# Patient Record
Sex: Female | Born: 1990 | Race: Black or African American | Hispanic: No | Marital: Single | Smoking: Current every day smoker
Health system: Southern US, Community
[De-identification: ages and names within clinical notes are randomized; demographics above are authoritative.]

## PROBLEM LIST (undated history)

## (undated) DIAGNOSIS — I2699 Other pulmonary embolism without acute cor pulmonale: Secondary | ICD-10-CM

## (undated) DIAGNOSIS — B977 Papillomavirus as the cause of diseases classified elsewhere: Secondary | ICD-10-CM

## (undated) DIAGNOSIS — I10 Essential (primary) hypertension: Secondary | ICD-10-CM

## (undated) HISTORY — DX: Papillomavirus as the cause of diseases classified elsewhere: B97.7

---

## 2018-02-09 ENCOUNTER — Other Ambulatory Visit: Payer: Self-pay

## 2018-02-09 ENCOUNTER — Encounter (HOSPITAL_COMMUNITY): Payer: Self-pay | Admitting: Emergency Medicine

## 2018-02-09 ENCOUNTER — Ambulatory Visit (HOSPITAL_COMMUNITY)
Admission: EM | Admit: 2018-02-09 | Discharge: 2018-02-09 | Disposition: A | Discharge status: Home / Self Care | Payer: Medicaid Other | Attending: Family Medicine | Admitting: Family Medicine

## 2018-02-09 DIAGNOSIS — R110 Nausea: Secondary | ICD-10-CM | POA: Diagnosis present

## 2018-02-09 DIAGNOSIS — R109 Unspecified abdominal pain: Secondary | ICD-10-CM | POA: Diagnosis not present

## 2018-02-09 DIAGNOSIS — F17210 Nicotine dependence, cigarettes, uncomplicated: Secondary | ICD-10-CM | POA: Insufficient documentation

## 2018-02-09 LAB — POCT URINALYSIS DIP (DEVICE)
BILIRUBIN URINE: NEGATIVE
Glucose, UA: NEGATIVE mg/dL
HGB URINE DIPSTICK: NEGATIVE
Ketones, ur: NEGATIVE mg/dL
LEUKOCYTES UA: NEGATIVE
Nitrite: NEGATIVE
PH: 7 (ref 5.0–8.0)
Protein, ur: NEGATIVE mg/dL
SPECIFIC GRAVITY, URINE: 1.02 (ref 1.005–1.030)
UROBILINOGEN UA: 0.2 mg/dL (ref 0.0–1.0)

## 2018-02-09 LAB — POCT PREGNANCY, URINE: Preg Test, Ur: NEGATIVE

## 2018-02-09 NOTE — ED Provider Notes (Signed)
[facility] CARE CENTER   [id number] 02/09/18 Arrival Time: 1700  SUBJECTIVE:  Angela Cantrell is a 27 y.o. female who presents with complaint of abdominal discomfort that began gradually today.  Denies a precipitating event, or trauma.  Localizes pain to right flank.  Describes as constant and sharp in character.  Has tried ibuprofen without relief.  Worse with sitting down. Denies similar symptoms in the past.  Last BM today normal for patient.  Complains of mild nausea.   Denies fever, chills, appetite changes, weight changes, vomiting, diarrhea, constipation, hematochezia, melena, dysuria, difficulty urinating, increased frequency or urgency, flank pain, loss of bowel or bladder function, vaginal or pelvic pain, vaginal bleeding.    Patient concerned about IUD placement.  Recently had a friend whose IUD had moved and requests pelvic exam.    Patient's last menstrual period was 01/14/2018.  ROS: As per HPI.  History reviewed. No pertinent past medical history. Past Surgical History:  Procedure Laterality Date  . CESAREAN SECTION     No Known Allergies No current facility-administered medications on file prior to encounter.    No current outpatient medications on file prior to encounter.   Social History   Socioeconomic History  . Marital status: Single    Spouse name: Not on file  . Number of children: Not on file  . Years of education: Not on file  . Highest education level: Not on file  Occupational History  . Not on file  Social Needs  . Financial resource strain: Not on file  . Food insecurity:    Worry: Not on file    Inability: Not on file  . Transportation needs:    Medical: Not on file    Non-medical: Not on file  Tobacco Use  . Smoking status: Current Every Day Smoker    Packs/day: 1.00  Substance and Sexual Activity  . Alcohol use: Yes  . Drug use: Yes    Types: Marijuana  . Sexual activity: Not on file  Lifestyle  . Physical activity:    Days  per week: Not on file    Minutes per session: Not on file  . Stress: Not on file  Relationships  . Social connections:    Talks on phone: Not on file    Gets together: Not on file    Attends religious service: Not on file    Active member of club or organization: Not on file    Attends meetings of clubs or organizations: Not on file    Relationship status: Not on file  . Intimate partner violence:    Fear of current or ex partner: Not on file    Emotionally abused: Not on file    Physically abused: Not on file    Forced sexual activity: Not on file  Other Topics Concern  . Not on file  Social History Narrative  . Not on file   Family History  Problem Relation Age of Onset  . Hypertension Mother   . Hypertension Father      OBJECTIVE:  Vitals:   02/09/18 1803  BP: (!) 131/93  Pulse: 62  Resp: 20  Temp: 98.3 F (36.8 C)  TempSrc: Oral  SpO2: 98%    General appearance: AOx3 in no acute distress; talking on phone HEENT: NCAT.  Oropharynx clear.  Lungs: clear to auscultation bilaterally without adventitious breath sounds Heart: regular rate and rhythm.  Radial pulses 2+ symmetrical bilaterally Abdomen: soft, non-distended; normal active bowel sounds; non-tender to light and  deep palpation; nontender at McBurney's point; negative Murphy's sign; negative rebound; no guarding; symptoms not reproducible on exam GU: On external examination no obvious lesions, discharge, or masses Bimanual exam performed prior to speculum exam.  Negative cervical motion or adenexal tenderness Speculum exam: Cervix visualized without obvious discharge from the cervix; IUD strings in 6 o'clock position.   Back: no CVA tenderness Extremities: no edema; symmetrical with no gross deformities Skin: warm and dry Neurologic: normal gait Psychological: alert and cooperative; anxious  Labs: Results for orders placed or performed during the hospital encounter of 02/09/18  POCT urinalysis dip  (device)  Result Value Ref Range   Glucose, UA NEGATIVE NEGATIVE mg/dL   Bilirubin Urine NEGATIVE NEGATIVE   Ketones, ur NEGATIVE NEGATIVE mg/dL   Specific Gravity, Urine 1.020 1.005 - 1.030   Hgb urine dipstick NEGATIVE NEGATIVE   pH 7.0 5.0 - 8.0   Protein, ur NEGATIVE NEGATIVE mg/dL   Urobilinogen, UA 0.2 0.0 - 1.0 mg/dL   Nitrite NEGATIVE NEGATIVE   Leukocytes, UA NEGATIVE NEGATIVE  Pregnancy, urine POC  Result Value Ref Range   Preg Test, Ur NEGATIVE NEGATIVE   Labs Reviewed  POCT URINALYSIS DIP (DEVICE)  POCT PREGNANCY, URINE  CERVICOVAGINAL ANCILLARY ONLY    Imaging: No results found.   ASSESSMENT & PLAN:  1. Abdominal discomfort in right flank     No orders of the defined types were placed in this encounter.  Urine did not show sign of infection, we will send it out to culture and notify you of abnormal results Mirena strings in place.  Please follow up with your PCP for further evaluation and treatment of IUD Urine pregnancy test was negative Cervical cytology obtained.  We will notify you with positive results and send in appropriate medications.  If any results are positive please refrain from sexual intercourse for 7 days and notify partners.   Rest, ice, and heat as needed Continue to use OTC ibuprofen as needed for symptomatic relief Go to the ER if you have any new or worsening symptoms  Reviewed expectations re: course of current medical issues. Questions answered. Outlined signs and symptoms indicating need for more acute intervention. Patient verbalized understanding. After Visit Summary given.   Rennis Harding, PA-C 02/09/18 1903

## 2018-02-09 NOTE — Discharge Instructions (Addendum)
Urine did not show sign of infection, we will send it out to culture and notify you of abnormal results Mirena strings in place.  Please follow up with your PCP for further evaluation and treatment of IUD Urine pregnancy test was negative Cervical cytology obtained.  We will notify you with positive results and send in appropriate medications.  If any results are positive please refrain from sexual intercourse for 7 days and notify partners.   Rest, ice, and heat as needed Continue to use OTC ibuprofen as needed for symptomatic relief Go to the ER if you have any new or worsening symptoms

## 2018-02-09 NOTE — ED Triage Notes (Signed)
Woke from a nap with abdominal pain.  Denies pain with urination.  Pain in right abdomen, right flank area.  Last bm was today, normal.

## 2018-02-10 LAB — CERVICOVAGINAL ANCILLARY ONLY
BACTERIAL VAGINITIS: NEGATIVE
Candida vaginitis: NEGATIVE
Chlamydia: NEGATIVE
Neisseria Gonorrhea: NEGATIVE
TRICH (WINDOWPATH): NEGATIVE

## 2018-02-10 LAB — URINE CULTURE: CULTURE: NO GROWTH

## 2018-08-07 ENCOUNTER — Ambulatory Visit (HOSPITAL_COMMUNITY)
Admission: EM | Admit: 2018-08-07 | Discharge: 2018-08-07 | Discharge status: Against Medical Advice | Payer: Medicaid Other

## 2019-03-09 ENCOUNTER — Telehealth: Payer: Self-pay

## 2019-03-09 NOTE — Telephone Encounter (Signed)
Received a referral from [facility] regarding birth control counseling. Scheduled the patient with a virtual visit to review options. Called the patient and left a detailed voicemail of the mychart visit and referral. Also sending a patient reminder letter with instructions.

## 2019-03-10 ENCOUNTER — Encounter: Payer: Self-pay | Admitting: *Deleted

## 2019-03-15 ENCOUNTER — Telehealth: Payer: Self-pay | Admitting: Family Medicine

## 2019-03-15 NOTE — Telephone Encounter (Signed)
Called patient was unable to get through, I was calling to remind her of her appt on 6-30, also calling to see if she downloaded the Mychart app.

## 2019-03-16 ENCOUNTER — Encounter: Payer: Self-pay | Admitting: Medical

## 2019-03-16 ENCOUNTER — Telehealth: Payer: Medicaid Other | Admitting: Medical

## 2019-03-16 ENCOUNTER — Other Ambulatory Visit: Payer: Self-pay

## 2019-03-16 NOTE — Progress Notes (Signed)
11:02a- Called pt for My Chart visit, immediately went to VM, Left message statinmg will retry in 10 to 15 minutes.  11:22a- 2nd attempt, call again went straight to VM. Needs t be rescheduled.

## 2019-03-16 NOTE — Progress Notes (Signed)
CMA unable to reach patient after multiple attempts.   Luvenia Redden, PA-C 03/16/2019 11:45 AM

## 2019-08-24 DIAGNOSIS — K219 Gastro-esophageal reflux disease without esophagitis: Secondary | ICD-10-CM | POA: Insufficient documentation

## 2019-12-01 ENCOUNTER — Other Ambulatory Visit: Payer: Self-pay | Admitting: Otolaryngology

## 2019-12-01 DIAGNOSIS — R591 Generalized enlarged lymph nodes: Secondary | ICD-10-CM

## 2019-12-15 ENCOUNTER — Other Ambulatory Visit (HOSPITAL_COMMUNITY): Payer: Self-pay | Admitting: Otolaryngology

## 2019-12-15 DIAGNOSIS — R591 Generalized enlarged lymph nodes: Secondary | ICD-10-CM

## 2019-12-20 ENCOUNTER — Ambulatory Visit (HOSPITAL_COMMUNITY): Payer: Medicaid Other

## 2020-01-06 ENCOUNTER — Other Ambulatory Visit (HOSPITAL_COMMUNITY): Payer: Self-pay | Admitting: Otolaryngology

## 2020-01-06 DIAGNOSIS — R591 Generalized enlarged lymph nodes: Secondary | ICD-10-CM

## 2020-01-11 ENCOUNTER — Ambulatory Visit (HOSPITAL_COMMUNITY): Payer: Medicaid Other

## 2020-01-14 ENCOUNTER — Ambulatory Visit (HOSPITAL_COMMUNITY)
Admission: RE | Admit: 2020-01-14 | Discharge: 2020-01-14 | Disposition: A | Discharge status: Home / Self Care | Payer: Medicaid Other | Source: Ambulatory Visit | Attending: Otolaryngology | Admitting: Otolaryngology

## 2020-01-14 ENCOUNTER — Other Ambulatory Visit: Payer: Self-pay

## 2020-01-14 DIAGNOSIS — R591 Generalized enlarged lymph nodes: Secondary | ICD-10-CM | POA: Diagnosis present

## 2020-01-17 ENCOUNTER — Encounter (HOSPITAL_COMMUNITY): Payer: Self-pay | Admitting: Radiology

## 2020-01-17 NOTE — Progress Notes (Signed)
Angela Cantrell Female, 29 y.o., March 03, 1991 MRN:  [id number] Phone:  [phone] Judie Petit) PCP:  System, Pcp Not In Coverage:  Medicaid [employer]/Medicaid [place] Access Next Appt With Radiology ([facility]-US 2) 01/21/2020 at 1:00 PM  RE: US Biopsy Received: Today Message Contents  Oley Balm, MD  Sherry Ruffing   [place] core bx cervical LAN    [facility]       Previous Messages   ----- Message -----  From: Henry Russel D  Sent: 01/17/2020  1:00 PM EDT  To: Ir Procedure Requests  Subject: US Biopsy                     Procedure: US Biopsy (lymph Node)   Reason:  Lymphadenopathy   History:  [place] in computer   Provider: Suzanna Obey   Provider Contact: [phone]

## 2020-01-20 ENCOUNTER — Other Ambulatory Visit: Payer: Self-pay | Admitting: Radiology

## 2020-01-21 ENCOUNTER — Other Ambulatory Visit: Payer: Self-pay

## 2020-01-21 ENCOUNTER — Ambulatory Visit (HOSPITAL_COMMUNITY)
Admission: RE | Admit: 2020-01-21 | Discharge: 2020-01-21 | Disposition: A | Discharge status: Home / Self Care | Payer: Medicaid Other | Source: Ambulatory Visit | Attending: Otolaryngology | Admitting: Otolaryngology

## 2020-01-21 DIAGNOSIS — F17210 Nicotine dependence, cigarettes, uncomplicated: Secondary | ICD-10-CM | POA: Diagnosis not present

## 2020-01-21 DIAGNOSIS — R591 Generalized enlarged lymph nodes: Secondary | ICD-10-CM

## 2020-01-21 DIAGNOSIS — R590 Localized enlarged lymph nodes: Secondary | ICD-10-CM | POA: Insufficient documentation

## 2020-01-21 LAB — PREGNANCY, URINE: Preg Test, Ur: NEGATIVE

## 2020-01-21 MED ORDER — LIDOCAINE HCL (PF) 1 % IJ SOLN
INTRAMUSCULAR | Status: AC
  Filled 2020-01-21: qty 30

## 2020-01-21 MED ORDER — FENTANYL CITRATE (PF) 100 MCG/2ML IJ SOLN
INTRAMUSCULAR | Status: AC | PRN
  Administered 2020-01-21 (×2): 50 ug via INTRAVENOUS

## 2020-01-21 MED ORDER — MIDAZOLAM HCL 2 MG/2ML IJ SOLN
INTRAMUSCULAR | Status: AC | PRN
  Administered 2020-01-21: 2 mg via INTRAVENOUS
  Administered 2020-01-21 (×2): 1 mg via INTRAVENOUS

## 2020-01-21 MED ORDER — SODIUM CHLORIDE 0.9 % IV SOLN: INTRAVENOUS | Status: DC

## 2020-01-21 MED ORDER — FENTANYL CITRATE (PF) 100 MCG/2ML IJ SOLN
INTRAMUSCULAR | Status: AC
  Filled 2020-01-21: qty 2

## 2020-01-21 MED ORDER — MIDAZOLAM HCL 2 MG/2ML IJ SOLN
INTRAMUSCULAR | Status: AC
  Filled 2020-01-21: qty 2

## 2020-01-21 NOTE — H&P (Signed)
Chief Complaint: Patient was seen in consultation today for lymphadenopathy at the request of Byers,John  Referring Physician(s): Byers,John  Supervising Physician: Malachy Moan  Patient Status: [facility] - Out-pt  History of Present Illness: Angela Cantrell is a 29 y.o. female with a past medical history of a pulmonary embolism in 2017 which occurred shortly after a pregnancy that culminated with a placental abruption, cesarean section and fetal demise. She is not on any blood thinners.   She has had bilateral cervical lymphadenopathy for one year.   Ultrasound imaging 01/14/20: Lobular enlarged lymph node in the right upper neck measures 2.1 x 1.0 x 1.0 cm. In the upper left neck there is a rounded enlarged lymph node that measures 1.6 x 0.7 x 0.9 cm.  Interventional Radiology has been asked to evaluate this patient for a left cervical lymph node biopsy for further evaluation and work up.   No past medical history on file.  Past Surgical History:  Procedure Laterality Date  . CESAREAN SECTION      Allergies: Patient has no known allergies.  Medications: Prior to Admission medications   Not on File     Family History  Problem Relation Age of Onset  . Hypertension Mother   . Hypertension Father     Social History   Socioeconomic History  . Marital status: Single    Spouse name: Not on file  . Number of children: Not on file  . Years of education: Not on file  . Highest education level: Not on file  Occupational History  . Not on file  Tobacco Use  . Smoking status: Current Every Day Smoker    Packs/day: 1.00  Substance and Sexual Activity  . Alcohol use: Yes  . Drug use: Yes    Types: Marijuana  . Sexual activity: Not on file  Other Topics Concern  . Not on file  Social History Narrative  . Not on file   Social Determinants of Health   Financial Resource Strain:   . Difficulty of Paying Living Expenses:   Food Insecurity:   . Worried  About Programme researcher, broadcasting/film/video in the Last Year:   . Barista in the Last Year:   Transportation Needs:   . Freight forwarder (Medical):   Marland Kitchen Lack of Transportation (Non-Medical):   Physical Activity:   . Days of Exercise per Week:   . Minutes of Exercise per Session:   Stress:   . Feeling of Stress :   Social Connections:   . Frequency of Communication with Friends and Family:   . Frequency of Social Gatherings with Friends and Family:   . Attends Religious Services:   . Active Member of Clubs or Organizations:   . Attends Banker Meetings:   Marland Kitchen Marital Status:     Review of Systems: A 12 point ROS discussed and pertinent positives are indicated in the HPI above.  All other systems are negative.  Review of Systems  Constitutional: Negative for activity change, appetite change, fatigue and fever.  Respiratory: Negative for cough and shortness of breath.   Cardiovascular: Negative for chest pain and leg swelling.  Gastrointestinal: Negative for abdominal distention and abdominal pain.  Musculoskeletal: Positive for neck stiffness.       Waxing/waning neck stiffness  Neurological: Negative for syncope and headaches.    Vital Signs: BP (!) 141/103   Pulse (!) 58   Temp 97.6 F (36.4 C) (Skin)   Resp 16  Ht 5\' 2"  (1.575 m)   Wt 230 lb (104.3 kg)   SpO2 97%   BMI 42.07 kg/m   Physical Exam Constitutional:      General: She is not in acute distress.    Appearance: Normal appearance.  Cardiovascular:     Rate and Rhythm: Normal rate and regular rhythm.     Pulses: Normal pulses.     Heart sounds: Normal heart sounds.  Pulmonary:     Effort: Pulmonary effort is normal.     Breath sounds: Normal breath sounds.  Abdominal:     General: Bowel sounds are normal. There is no distension.     Palpations: Abdomen is soft.     Tenderness: There is no abdominal tenderness.  Musculoskeletal:     Comments: Intermittent bilateral neck tenderness.   Skin:     General: Skin is warm and dry.  Neurological:     Mental Status: She is alert and oriented to person, place, and time.  Psychiatric:        Mood and Affect: Mood normal.        Behavior: Behavior normal.        Thought Content: Thought content normal.        Judgment: Judgment normal.     Imaging: SOFT TISSUE HEAD & NECK (NON-THYROID)  Result Date: 01/15/2020 CLINICAL DATA:  Bilateral cervical lymphadenopathy for 1 year EXAM: ULTRASOUND OF HEAD/NECK SOFT TISSUES TECHNIQUE: Ultrasound examination of the head and neck soft tissues was performed in the area of clinical concern. COMPARISON:  None. FINDINGS: Lobular enlarged lymph node in the right upper neck measures 2.1 x 1.0 x 1.0 cm. In the upper left neck there is a rounded enlarged lymph node that measures 1.6 x 0.7 x 0.9 cm. IMPRESSION: Bilateral enlarged cervical lymph nodes with rounded morphology. CT of the neck with contrast is recommended for further characterization. Electronically Signed   By: 03/16/2020 M.D.   On: 01/15/2020 00:03    Labs:  CBC: No results for input(s): WBC, HGB, HCT, PLT in the last 8760 hours.  COAGS: No results for input(s): INR, APTT in the last 8760 hours.  BMP: No results for input(s): NA, K, CL, CO2, GLUCOSE, BUN, CALCIUM, CREATININE, GFRNONAA, GFRAA in the last 8760 hours.  Invalid input(s): CMP  LIVER FUNCTION TESTS: No results for input(s): BILITOT, AST, ALT, ALKPHOS, PROT, ALBUMIN in the last 8760 hours.  TUMOR MARKERS: No results for input(s): AFPTM, CEA, CA199, CHROMGRNA in the last 8760 hours.  Assessment and Plan: Angela Cantrell is a 29 year-old female with a history of lymphadenopathy for one year. Recent ultrasound demonstrates enlarged cervical lymph nodes.   The patient presents today to the [facility] Interventional Radiology department for an image-guided lymph node biopsy. The patient will not be sedated for this procedure.   Risks and benefits of an image-guided lymph node  was discussed with the patient and/or patient's family including, but not limited to bleeding, infection, damage to adjacent structures or low yield requiring additional tests.  All of the questions were answered and there is agreement to proceed.  Consent signed and in chart.   Thank you for this interesting consult.  I greatly enjoyed meeting [facility] and look forward to participating in their care.  A copy of this report was sent to the requesting provider on this date.  Electronically Signed: Jiles Prows, NP 01/21/2020, 11:27 AM   I spent a total of  30 Minutes  in  face to face in clinical consultation, greater than 50% of which was counseling/coordinating care for lymph node biopsy.

## 2020-01-21 NOTE — Discharge Instructions (Addendum)

## 2020-01-21 NOTE — Procedures (Signed)
Interventional Radiology Procedure Note  Procedure: [place] core biopsy of right high cervical/submandibular lymph node.   Complications: None  Estimated Blood Loss: None  Recommendations: - DC HOME  Signed,  Sterling Big, MD

## 2020-01-25 LAB — SURGICAL PATHOLOGY

## 2020-09-26 ENCOUNTER — Encounter (HOSPITAL_COMMUNITY): Payer: Self-pay

## 2020-09-26 ENCOUNTER — Other Ambulatory Visit: Payer: Self-pay

## 2020-09-26 ENCOUNTER — Ambulatory Visit (HOSPITAL_COMMUNITY)
Admission: EM | Admit: 2020-09-26 | Discharge: 2020-09-26 | Disposition: A | Discharge status: Home / Self Care | Payer: Medicaid Other | Attending: Family Medicine | Admitting: Family Medicine

## 2020-09-26 DIAGNOSIS — U071 COVID-19: Secondary | ICD-10-CM | POA: Insufficient documentation

## 2020-09-26 DIAGNOSIS — R52 Pain, unspecified: Secondary | ICD-10-CM

## 2020-09-26 DIAGNOSIS — R059 Cough, unspecified: Secondary | ICD-10-CM

## 2020-09-26 DIAGNOSIS — R6883 Chills (without fever): Secondary | ICD-10-CM | POA: Diagnosis present

## 2020-09-26 LAB — SARS CORONAVIRUS 2 (TAT 6-24 HRS): SARS Coronavirus 2: POSITIVE — AB

## 2020-09-26 NOTE — Discharge Instructions (Signed)
You have been tested for COVID-19 today. °If your test returns positive, you will receive a phone call from [facility] regarding your results. °Negative test results are not called. °Both positive and negative results area always visible on MyChart. °If you do not have a MyChart account, sign up instructions are provided in your discharge papers. °Please do not hesitate to contact us should you have questions or concerns. ° °

## 2020-09-26 NOTE — ED Triage Notes (Signed)
Pt presenst with cough x 2 days; chills, body aches and  headache x 1 day. Tylenol gives somewhat relief. Denies fever, sore throat, shortness of breath.

## 2020-09-26 NOTE — ED Provider Notes (Signed)
  [facility] CARE CENTER   [id number] 09/26/20 Arrival Time: 1228  ASSESSMENT & PLAN:  1. Chills   2. Cough   3. Body aches     COVID-19 testing sent. See letter/work note on file for self-isolation guidelines. OTC symptom care as needed.    Follow-up Information    [facility].   Specialty: Urgent Care Why: As needed. Contact information: [address] [place] [address] [phone]              Reviewed expectations re: course of current medical issues. Questions answered. Outlined signs and symptoms indicating need for more acute intervention. Understanding verbalized. After Visit Summary given.   SUBJECTIVE: History from: patient. Angela Cantrell is a 30 y.o. female who presents with worries regarding COVID-19. Known COVID-19 contact: none. Recent travel: none. Reports: cough, chills, body aches, headache x 1 day; son also sick. Denies: fever and difficulty breathing. Normal PO intake without n/v/d.    OBJECTIVE:  Vitals:   09/26/20 1351  BP: (!) 144/97  Pulse: 61  Resp: 18  Temp: 99.6 F (37.6 C)  TempSrc: Oral  SpO2: 100%    General appearance: alert; no distress Eyes: PERRLA; EOMI; conjunctiva normal HENT: [place]; AT; with nasal congestion Neck: supple  Lungs: speaks full sentences without difficulty; unlabored Extremities: no edema Skin: warm and dry Neurologic: normal gait Psychological: alert and cooperative; normal mood and affect  Labs:  Labs Reviewed  SARS CORONAVIRUS 2 (TAT 6-24 HRS)    No Known Allergies  No past medical history on file. Social History   Socioeconomic History  . Marital status: Single    Spouse name: Not on file  . Number of children: Not on file  . Years of education: Not on file  . Highest education level: Not on file  Occupational History  . Not on file  Tobacco Use  . Smoking status: Current Every Day Smoker    Packs/day: 1.00  . Smokeless tobacco:  Never Used  Substance and Sexual Activity  . Alcohol use: Yes  . Drug use: Yes    Types: Marijuana  . Sexual activity: Not on file  Other Topics Concern  . Not on file  Social History Narrative  . Not on file   Social Determinants of Health   Financial Resource Strain: Not on file  Food Insecurity: Not on file  Transportation Needs: Not on file  Physical Activity: Not on file  Stress: Not on file  Social Connections: Not on file  Intimate Partner Violence: Not on file   Family History  Problem Relation Age of Onset  . Hypertension Mother   . Hypertension Father    Past Surgical History:  Procedure Laterality Date  . CESAREAN SECTION       Mardella Layman, MD 09/26/20 1414

## 2020-12-20 ENCOUNTER — Other Ambulatory Visit: Payer: Self-pay

## 2020-12-20 ENCOUNTER — Ambulatory Visit (INDEPENDENT_AMBULATORY_CARE_PROVIDER_SITE_OTHER): Payer: Medicaid Other | Admitting: Certified Nurse Midwife

## 2020-12-20 ENCOUNTER — Encounter: Payer: Self-pay | Admitting: Certified Nurse Midwife

## 2020-12-20 ENCOUNTER — Other Ambulatory Visit (HOSPITAL_COMMUNITY)
Admission: RE | Admit: 2020-12-20 | Discharge: 2020-12-20 | Disposition: A | Discharge status: Home / Self Care | Payer: Medicaid Other | Source: Ambulatory Visit | Attending: Certified Nurse Midwife | Admitting: Certified Nurse Midwife

## 2020-12-20 VITALS — BP 118/86 | HR 65 | Wt 203.9 lb

## 2020-12-20 DIAGNOSIS — R87610 Atypical squamous cells of undetermined significance on cytologic smear of cervix (ASC-US): Secondary | ICD-10-CM | POA: Insufficient documentation

## 2020-12-20 DIAGNOSIS — Z113 Encounter for screening for infections with a predominantly sexual mode of transmission: Secondary | ICD-10-CM

## 2020-12-20 DIAGNOSIS — Z01419 Encounter for gynecological examination (general) (routine) without abnormal findings: Secondary | ICD-10-CM | POA: Insufficient documentation

## 2020-12-20 DIAGNOSIS — R4589 Other symptoms and signs involving emotional state: Secondary | ICD-10-CM

## 2020-12-20 DIAGNOSIS — L732 Hidradenitis suppurativa: Secondary | ICD-10-CM | POA: Diagnosis not present

## 2020-12-20 MED ORDER — CLINDAMYCIN PHOSPHATE 1 % EX GEL: Freq: Two times a day (BID) | CUTANEOUS | 11 refills | Status: DC

## 2020-12-20 MED ORDER — CLINDAMYCIN PHOSPHATE 1 % EX SOLN: Freq: Every day | CUTANEOUS | 4 refills | Status: DC

## 2020-12-20 NOTE — Progress Notes (Signed)
GYNECOLOGY CLINIC ANNUAL PREVENTATIVE CARE ENCOUNTER NOTE  Subjective:   Angela Cantrell is a 30 y.o. [phone] female here for a routine annual gynecologic exam.  Current complaints: ongoing struggle with "painful bumps that come in go in areas that sweat and have hair follicles." Has never been diagnosed with hidradenitis supperativa but has been on antibiotics (doxycycline) off and on for the bumps. Has been trying to get pregnant (unsuccessfully) but has regular cycles and no problems getting pregnant in the past. Husband is a newly diagnosed T2DM, just able to bring A1C to normal range.  Denies abnormal vaginal bleeding, discharge, pelvic pain, problems with intercourse or other gynecologic concerns.    Gynecologic History Patient's last menstrual period was 11/29/2020 (approximate). Contraception: none, desires pregnancy Last Pap: Unknown, last in system was 2014. Results were: abnormal Last mammogram: N/A.   Obstetric History OB History  Gravida Para Term Preterm AB Living  $Remov'4 4 2 2 'qFpYKc$ 0 2  SAB IAB Ectopic Multiple Live Births  0 0 0 0 3    # Outcome Date GA Lbr Len/2nd Weight Sex Delivery Anes PTL Lv  4 Preterm 2017    M CS-LVertical   ND  3 Preterm 2016    M Vag-Spont   FD  2 Term 2010    M CS-LVertical   LIV  1 Term 2007    M CS-LVertical   LIV   No past medical history on file.  Past Surgical History:  Procedure Laterality Date  . CESAREAN SECTION     Current Outpatient Medications on File Prior to Visit  Medication Sig Dispense Refill  . VITAMIN D PO Take by mouth once a week.    Marland Kitchen acetaminophen (TYLENOL) 325 MG tablet Take 650 mg by mouth every 6 (six) hours as needed. (Patient not taking: Reported on 12/20/2020)    . doxycycline (VIBRAMYCIN) 100 MG capsule Take 100 mg by mouth 2 (two) times daily. (Patient not taking: Reported on 12/20/2020)    . meloxicam (MOBIC) 15 MG tablet Take 1 tablet by mouth daily. (Patient not taking: Reported on 12/20/2020)    .  sulfamethoxazole-trimethoprim (BACTRIM DS) 800-160 MG tablet Take 1 tablet by mouth 2 (two) times daily. (Patient not taking: Reported on 12/20/2020)     No current facility-administered medications on file prior to visit.   No Known Allergies  Social History   Socioeconomic History  . Marital status: Single    Spouse name: Not on file  . Number of children: Not on file  . Years of education: Not on file  . Highest education level: Not on file  Occupational History  . Not on file  Tobacco Use  . Smoking status: Current Every Day Smoker    Packs/day: 0.25    Types: Cigarettes  . Smokeless tobacco: Never Used  Vaping Use  . Vaping Use: Every day  Substance and Sexual Activity  . Alcohol use: Yes    Comment: less than once a week  . Drug use: Yes    Types: Marijuana  . Sexual activity: Yes    Partners: Male    Birth control/protection: None  Other Topics Concern  . Not on file  Social History Narrative  . Not on file   Social Determinants of Health   Financial Resource Strain: Not on file  Food Insecurity: Food Insecurity Present  . Worried About Charity fundraiser in the Last Year: Sometimes true  . Ran Out of Food in the Last Year:  Never true  Transportation Needs: No Transportation Needs  . Lack of Transportation (Medical): No  . Lack of Transportation (Non-Medical): No  Physical Activity: Not on file  Stress: Not on file  Social Connections: Not on file  Intimate Partner Violence: Not on file    Family History  Problem Relation Age of Onset  . Hypertension Mother   . Hypertension Father   . Lymphoma Paternal Grandmother   . Diabetes Paternal Grandmother    The following portions of the patient's history were reviewed and updated as appropriate: allergies, current medications, past family history, past medical history, past social history, past surgical history and problem list.  Review of Systems Pertinent items noted in HPI and remainder of comprehensive  ROS otherwise negative.   Objective:  BP 118/86   Pulse 65   Wt 203 lb 14.4 oz (92.5 kg)   LMP 11/29/2020 (Approximate)   BMI 37.29 kg/m  CONSTITUTIONAL: Well-developed, well-nourished female in no acute distress.  HENT:  Normocephalic, atraumatic, External right and left ear normal. Oropharynx is clear and moist EYES: Conjunctivae and EOM are normal. Pupils are equal, round, and reactive to light. No scleral icterus.  NECK: Normal range of motion, supple, no masses.  Normal thyroid.  SKIN: Skin is warm and dry. Evidence of current and past hydradenitis supperativa. Not diaphoretic. No erythema. No pallor. [place]: Alert and oriented to person, place, and time. Normal reflexes, muscle tone coordination. No cranial nerve deficit noted. PSYCHIATRIC: Normal mood and affect. Normal behavior. Normal judgment and thought content. Expressed feelings of anxiety related to world events, living in [place] as a Black woman. Provided validation and suggested referral to behavioral health to increase coping skills CARDIOVASCULAR: Normal heart rate noted, regular rhythm RESPIRATORY: Clear to auscultation bilaterally. Effort and breath sounds normal, no problems with respiration noted. BREASTS: Symmetric in size. No masses, skin changes, nipple drainage, or lymphadenopathy. ABDOMEN: Soft, normal bowel sounds, no distention noted.  No tenderness, rebound or guarding.  PELVIC: Normal appearing external genitalia; normal appearing vaginal mucosa and cervix.  No abnormal discharge noted.  Pap smear obtained.  Normal uterine size, no other palpable masses, no uterine or adnexal tenderness. MUSCULOSKELETAL: Normal range of motion. No tenderness.  No cyanosis, clubbing, or edema.  2+ distal pulses.   Assessment & Plan:  Well woman exam with routine gynecological exam - Plan: Cytology - PAP( [place]), Vitamin D (25 hydroxy), CBC, Comp Met (CMET), HgB A1c, Lipid Profile, Ambulatory referral to [place], Cervicovaginal ancillary only( [place])  Screening examination for STD (sexually transmitted disease) - Plan: HIV Antibody (routine testing w rflx), RPR, Hepatitis C Antibody, Hepatitis B Surface AntiGEN  Feeling anxious  Hidradenitis suppurativa - Plan: clindamycin (CLEOCIN-T) 1 % external solution, clindamycin (CLINDAGEL) 1 % gel  - Will follow results of testing and follow accordingly - Referral made to [facility] Lynnea Ferrier) - Reassurance given to patient that husband's DM may play a role in her trouble getting pregnant and encouraged her to begin counting from the time his A1C came under control. Also advised he have his sperm evaluated at [facility]. - Routine preventative health maintenance measures emphasized.  Follow up in one year or PRN.  Gaylan Gerold, CNM, MSN, [place] Certified Nurse Midwife, [facility]

## 2020-12-21 LAB — COMPREHENSIVE METABOLIC PANEL
ALT: 14 IU/L (ref 0–32)
AST: 19 IU/L (ref 0–40)
Albumin/Globulin Ratio: 1.7 (ref 1.2–2.2)
Albumin: 4.5 g/dL (ref 3.8–4.8)
Alkaline Phosphatase: 57 IU/L (ref 44–121)
BUN/Creatinine Ratio: 13 (ref 9–23)
BUN: 12 mg/dL (ref 6–20)
Bilirubin Total: 0.3 mg/dL (ref 0.0–1.2)
CO2: 20 mmol/L (ref 20–29)
Calcium: 9.3 mg/dL (ref 8.7–10.2)
Chloride: 105 mmol/L (ref 96–106)
Creatinine, Ser: 0.95 mg/dL (ref 0.57–1.00)
Globulin, Total: 2.6 g/dL (ref 1.5–4.5)
Glucose: 87 mg/dL (ref 65–99)
Potassium: 4.6 mmol/L (ref 3.5–5.2)
Sodium: 140 mmol/L (ref 134–144)
Total Protein: 7.1 g/dL (ref 6.0–8.5)
eGFR: 82 mL/min/{1.73_m2} (ref >59)

## 2020-12-21 LAB — HEMOGLOBIN A1C
Est. average glucose Bld gHb Est-mCnc: 114 mg/dL
Hgb A1c MFr Bld: 5.6 % (ref 4.8–5.6)

## 2020-12-21 LAB — CBC
Hematocrit: 42.9 % (ref 34.0–46.6)
Hemoglobin: 14.7 g/dL (ref 11.1–15.9)
MCH: 32 pg (ref 26.6–33.0)
MCHC: 34.3 g/dL (ref 31.5–35.7)
MCV: 93 fL (ref 79–97)
Platelets: 263 10*3/uL (ref 150–450)
RBC: 4.6 x10E6/uL (ref 3.77–5.28)
RDW: 13 % (ref 11.7–15.4)
WBC: 4.9 10*3/uL (ref 3.4–10.8)

## 2020-12-21 LAB — LIPID PANEL
Chol/HDL Ratio: 4.4 ratio (ref 0.0–4.4)
Cholesterol, Total: 213 mg/dL — ABNORMAL HIGH (ref 100–199)
HDL: 48 mg/dL (ref >39)
LDL Chol Calc (NIH): 153 mg/dL — ABNORMAL HIGH (ref 0–99)
Triglycerides: 69 mg/dL (ref 0–149)
VLDL Cholesterol Cal: 12 mg/dL (ref 5–40)

## 2020-12-21 LAB — HIV ANTIBODY (ROUTINE TESTING W REFLEX): HIV Screen 4th Generation wRfx: NONREACTIVE

## 2020-12-21 LAB — VITAMIN D 25 HYDROXY (VIT D DEFICIENCY, FRACTURES): Vit D, 25-Hydroxy: 23 ng/mL — ABNORMAL LOW (ref 30.0–100.0)

## 2020-12-21 LAB — CERVICOVAGINAL ANCILLARY ONLY
Bacterial Vaginitis (gardnerella): NEGATIVE
Candida Glabrata: NEGATIVE
Candida Vaginitis: POSITIVE — AB
Comment: NEGATIVE
Comment: NEGATIVE
Comment: NEGATIVE

## 2020-12-21 LAB — HEPATITIS B SURFACE ANTIGEN: Hepatitis B Surface Ag: NEGATIVE

## 2020-12-21 LAB — HEPATITIS C ANTIBODY: Hep C Virus Ab: <0.1 s/co ratio (ref 0.0–0.9)

## 2020-12-21 LAB — RPR: RPR Ser Ql: NONREACTIVE

## 2020-12-25 LAB — CYTOLOGY - PAP
Adequacy: ABSENT
Chlamydia: NEGATIVE
Comment: NEGATIVE
Comment: NEGATIVE
Comment: NEGATIVE
Comment: NORMAL
Diagnosis: NEGATIVE
High risk HPV: NEGATIVE
Neisseria Gonorrhea: NEGATIVE
Trichomonas: NEGATIVE

## 2021-01-04 NOTE — BH Specialist Note (Signed)
Pt did not arrive to video visit and did not answer the phone; Left HIPPA-compliant message to call back Precious Segall from [facility] at  [phone] (Kambrie Eddleman's office).  ?; left MyChart message for patient.  ? ?

## 2021-01-06 ENCOUNTER — Encounter (HOSPITAL_COMMUNITY): Payer: Self-pay | Admitting: *Deleted

## 2021-01-06 ENCOUNTER — Other Ambulatory Visit: Payer: Self-pay

## 2021-01-06 ENCOUNTER — Emergency Department (HOSPITAL_COMMUNITY)
Admission: EM | Admit: 2021-01-06 | Discharge: 2021-01-07 | Disposition: A | Discharge status: Home / Self Care | Payer: Medicaid Other | Attending: Emergency Medicine | Admitting: Emergency Medicine

## 2021-01-06 DIAGNOSIS — I10 Essential (primary) hypertension: Secondary | ICD-10-CM | POA: Insufficient documentation

## 2021-01-06 DIAGNOSIS — J029 Acute pharyngitis, unspecified: Secondary | ICD-10-CM

## 2021-01-06 DIAGNOSIS — F17210 Nicotine dependence, cigarettes, uncomplicated: Secondary | ICD-10-CM | POA: Insufficient documentation

## 2021-01-06 HISTORY — DX: Essential (primary) hypertension: I10

## 2021-01-06 NOTE — ED Provider Notes (Signed)
MSE was initiated and I personally evaluated the patient and placed orders (if any) at  10:58 PM on January 06, 2021.  Patient here with sore throat.  Also reports congestion.  Vital signs are stable.  Oropharynx is unremarkable.  Rapid strep ordered.  ? Allergic rhinitis/pharyngitis  Discussed with patient that their care has been initiated.   They are counseled that they will need to remain in the ED until the completion of their workup, including full H&P and results of any tests.  Risks of leaving the emergency department prior to completion of treatment were discussed. Patient was advised to inform ED staff if they are leaving before their treatment is complete. The patient acknowledged these risks and time was allowed for questions.    The patient appears stable so that the remainder of the MSE may be completed by another provider.    Roxy Horseman, PA-C 01/06/21 2300    Little, Ambrose Finland, MD 01/07/21 [phone]

## 2021-01-06 NOTE — ED Triage Notes (Signed)
sorethroat for one year worse today  lmp last week

## 2021-01-07 ENCOUNTER — Other Ambulatory Visit: Payer: Self-pay

## 2021-01-07 LAB — GROUP A STREP BY PCR: Group A Strep by PCR: NOT DETECTED

## 2021-01-07 NOTE — ED Notes (Signed)
Pt yelled at this RN stating she is dying and her throat is closed. This RN assured pt she is speaking in full paragraph-style sentences and that an O2 sat could also be checked. Pt refused VS and ripped d/c paperwork out of this RN's hand. Pt walked out of the ED.

## 2021-01-07 NOTE — ED Provider Notes (Signed)
[facility] EMERGENCY DEPARTMENT Provider Note   CSN: [id number] Arrival date & time: 01/06/21  2050     History Chief Complaint  Patient presents with  . Sore Throat    Angela Cantrell is a 30 y.o. female.  Patient presents to the emergency department with a chief complaint of sore throat and raspy voice.  She has been having the symptoms for over a year.  Has been seen by ENT several times.  She states that she would like a CT scan of her neck.  She denies fevers.  Has tried nasal sprays and omeprazole.  Has been diagnosed by ENT with laryngeal reflux.  The history is provided by the patient. No language interpreter was used.       Past Medical History:  Diagnosis Date  . Hypertension     Patient Active Problem List   Diagnosis Date Noted  . Atypical squamous cells of undetermined significance on cytologic smear of cervix (ASC-US) 12/20/2020  . Laryngopharyngeal reflux (LPR) 08/24/2019    Past Surgical History:  Procedure Laterality Date  . CESAREAN SECTION       OB History    Gravida  4   Para  4   Term  2   Preterm  2   AB  0   Living  2     SAB  0   IAB  0   Ectopic  0   Multiple  0   Live Births  3           Family History  Problem Relation Age of Onset  . Hypertension Mother   . Hypertension Father   . Lymphoma Paternal Grandmother   . Diabetes Paternal Grandmother     Social History   Tobacco Use  . Smoking status: Current Every Day Smoker    Packs/day: 0.25    Types: Cigarettes  . Smokeless tobacco: Never Used  Vaping Use  . Vaping Use: Every day  Substance Use Topics  . Alcohol use: Yes    Comment: less than once a week  . Drug use: Yes    Types: Marijuana    Home Medications Prior to Admission medications   Medication Sig Start Date End Date Taking? Authorizing Provider  acetaminophen (TYLENOL) 325 MG tablet Take 650 mg by mouth every 6 (six) hours as needed. Patient not taking: Reported  on 12/20/2020    [provider]  clindamycin (CLEOCIN-T) 1 % external solution Apply topically daily. Add to shower wash daily 12/20/20   Bernerd Limbo, CNM  clindamycin (CLINDAGEL) 1 % gel Apply topically 2 (two) times daily. Apply to areas of concern twice daily 12/20/20   Bernerd Limbo, CNM  doxycycline (VIBRAMYCIN) 100 MG capsule Take 100 mg by mouth 2 (two) times daily. Patient not taking: Reported on 12/20/2020 08/07/20   [provider]  meloxicam (MOBIC) 15 MG tablet Take 1 tablet by mouth daily. Patient not taking: Reported on 12/20/2020 09/04/20   [provider]  sulfamethoxazole-trimethoprim (BACTRIM DS) 800-160 MG tablet Take 1 tablet by mouth 2 (two) times daily. Patient not taking: Reported on 12/20/2020 06/27/20   [provider]  VITAMIN D PO Take by mouth once a week.    [provider]    Allergies    Patient has no known allergies.  Review of Systems   Review of Systems  All other systems reviewed and are negative.   Physical Exam Updated Vital Signs BP 125/76 (BP Location:  Left Arm)   Pulse 63   Temp 98.4 F (36.9 C) (Oral)   Resp 19   Ht 5\' 2"  (1.575 m)   Wt 92.5 kg   LMP 12/28/2020   SpO2 100%   BMI 37.30 kg/m   Physical Exam Vitals and nursing note reviewed.  Constitutional:      General: She is not in acute distress.    Appearance: She is well-developed.  HENT:     Head: Normocephalic and atraumatic.     Mouth/Throat:     Mouth: Mucous membranes are moist.     Pharynx: Oropharynx is clear. No oropharyngeal exudate or posterior oropharyngeal erythema.     Comments: Oropharynx is clear, no exudates, no edema, no stridor, no abscess Eyes:     Conjunctiva/sclera: Conjunctivae normal.  Cardiovascular:     Rate and Rhythm: Normal rate.     Heart sounds: No murmur heard.   Pulmonary:     Effort: Pulmonary effort is normal. No respiratory distress.  Abdominal:     General: There is no distension.   Musculoskeletal:     Cervical back: Neck supple.     Comments: Moves all extremities  Skin:    General: Skin is warm and dry.  Neurological:     Mental Status: She is alert and oriented to person, place, and time.  Psychiatric:        Mood and Affect: Mood normal.        Behavior: Behavior normal.     ED Results / Procedures / Treatments   Labs (all labs ordered are listed, but only abnormal results are displayed) Labs Reviewed  GROUP A STREP BY PCR    EKG None  Radiology No results found.  Procedures Procedures   Medications Ordered in ED Medications - No data to display  ED Course  I have reviewed the triage vital signs and the nursing notes.  Pertinent labs & imaging results that were available during my care of the patient were reviewed by me and considered in my medical decision making (see chart for details).    MDM Rules/Calculators/A&P                          Patient here with complaints of sore throat and raspy voice.  Has been going on for over a year.  Has been seen by ENT several times.  Diagnosed with laryngeal reflux.    No concerning features on exam tonight.  Patient requesting CT scan of neck.  I told her that this was not emergently indicated.  Recommend ENT/PCP followup.  Case discussed with Dr. 12/30/2020, who agrees with plan for discharge.  CT not indicated. Final Clinical Impression(s) / ED Diagnoses Final diagnoses:  Sore throat    Rx / DC Orders ED Discharge Orders    None       Nicanor Alcon, PA-C 01/07/21 0325    Palumbo, April, MD 01/07/21 [phone]

## 2021-01-18 ENCOUNTER — Ambulatory Visit: Payer: Medicaid Other | Admitting: Clinical

## 2021-01-18 DIAGNOSIS — Z91199 Patient's noncompliance with other medical treatment and regimen due to unspecified reason: Secondary | ICD-10-CM

## 2021-01-18 DIAGNOSIS — Z5329 Procedure and treatment not carried out because of patient's decision for other reasons: Secondary | ICD-10-CM

## 2021-03-17 ENCOUNTER — Emergency Department (HOSPITAL_COMMUNITY)
Admission: EM | Admit: 2021-03-17 | Discharge: 2021-03-18 | Disposition: A | Discharge status: Home / Self Care | Payer: Medicaid Other | Attending: Emergency Medicine | Admitting: Emergency Medicine

## 2021-03-17 ENCOUNTER — Encounter (HOSPITAL_COMMUNITY): Payer: Self-pay | Admitting: Emergency Medicine

## 2021-03-17 ENCOUNTER — Emergency Department (HOSPITAL_COMMUNITY): Payer: Medicaid Other

## 2021-03-17 ENCOUNTER — Other Ambulatory Visit: Payer: Self-pay

## 2021-03-17 DIAGNOSIS — I10 Essential (primary) hypertension: Secondary | ICD-10-CM | POA: Insufficient documentation

## 2021-03-17 DIAGNOSIS — H9201 Otalgia, right ear: Secondary | ICD-10-CM | POA: Insufficient documentation

## 2021-03-17 DIAGNOSIS — R079 Chest pain, unspecified: Secondary | ICD-10-CM | POA: Diagnosis not present

## 2021-03-17 DIAGNOSIS — F17210 Nicotine dependence, cigarettes, uncomplicated: Secondary | ICD-10-CM | POA: Diagnosis not present

## 2021-03-17 LAB — BASIC METABOLIC PANEL
Anion gap: 6 (ref 5–15)
BUN: 10 mg/dL (ref 6–20)
CO2: 22 mmol/L (ref 22–32)
Calcium: 9 mg/dL (ref 8.9–10.3)
Chloride: 108 mmol/L (ref 98–111)
Creatinine, Ser: 0.93 mg/dL (ref 0.44–1.00)
GFR, Estimated: >60 mL/min (ref >60)
Glucose, Bld: 100 mg/dL — ABNORMAL HIGH (ref 70–99)
Potassium: 4.1 mmol/L (ref 3.5–5.1)
Sodium: 136 mmol/L (ref 135–145)

## 2021-03-17 LAB — CBC WITH DIFFERENTIAL/PLATELET
Abs Immature Granulocytes: 0.02 10*3/uL (ref 0.00–0.07)
Basophils Absolute: 0 10*3/uL (ref 0.0–0.1)
Basophils Relative: 1 %
Eosinophils Absolute: 0.1 10*3/uL (ref 0.0–0.5)
Eosinophils Relative: 1 %
HCT: 40.7 % (ref 36.0–46.0)
Hemoglobin: 13.5 g/dL (ref 12.0–15.0)
Immature Granulocytes: 0 %
Lymphocytes Relative: 35 %
Lymphs Abs: 2.9 10*3/uL (ref 0.7–4.0)
MCH: 31.4 pg (ref 26.0–34.0)
MCHC: 33.2 g/dL (ref 30.0–36.0)
MCV: 94.7 fL (ref 80.0–100.0)
Monocytes Absolute: 0.7 10*3/uL (ref 0.1–1.0)
Monocytes Relative: 9 %
Neutro Abs: 4.4 10*3/uL (ref 1.7–7.7)
Neutrophils Relative %: 54 %
Platelets: 269 10*3/uL (ref 150–400)
RBC: 4.3 MIL/uL (ref 3.87–5.11)
RDW: 13.3 % (ref 11.5–15.5)
WBC: 8.1 10*3/uL (ref 4.0–10.5)
nRBC: 0 % (ref 0.0–0.2)

## 2021-03-17 LAB — TROPONIN I (HIGH SENSITIVITY)
Troponin I (High Sensitivity): 5 ng/L (ref <18)
Troponin I (High Sensitivity): 3 ng/L (ref <18)

## 2021-03-17 LAB — I-STAT BETA HCG BLOOD, ED (MC, WL, AP ONLY): I-stat hCG, quantitative: <5 m[IU]/mL (ref <5)

## 2021-03-17 NOTE — ED Provider Notes (Signed)
Emergency Medicine Provider Triage Evaluation Note  Angela Cantrell , a 30 y.o. female  was evaluated in triage.  Pt complains of gradual onset, constant, achy, R ear pain that began 5 days ago. Pt states she has seen 2 providers for it this week and was prescribed an ear drop without relief. She states today she began having substernal chest pain when she "moves a certain way". She has been taking Ibuprofen without relief. She decided to come to the ED for further evaluation. She reports hx of PE in 2017 after having a child however states this does not feel anywhere near as severe as then. She denies fevers, chills, cough, SOB. She denies recent sick contacts.   Review of Systems  Positive: + ear pain, chest pain Negative: - fevers, chills, SOB, nausea, vomiting  Physical Exam  BP (!) 148/96 (BP Location: Left Arm)   Pulse 60   Temp 98.9 F (37.2 C) (Oral)   Resp 18   Ht 5\' 2"  (1.575 m)   Wt 92.1 kg   SpO2 100%   BMI 37.13 kg/m  Gen:   Awake, no distress   Resp:  Normal effort  MSK:   Moves extremities without difficulty  Other:  + R auricular lymphadenopathy with TTP. No TTP to mastoid. EAC clear and TM clear on R side. + chest wall TTP  Medical Decision Making  Medically screening exam initiated at 7:31 PM.  Appropriate orders placed.  was informed that the remainder of the evaluation will be completed by another provider, this initial triage assessment does not replace that evaluation, and the importance of remaining in the ED until their evaluation is complete.     Jiles Prows, PA-C 03/17/21 1933    05/18/21, MD 03/17/21 2028

## 2021-03-17 NOTE — ED Triage Notes (Signed)
Pt via POV c/o otalgia and chest pain. Seen at to PCP for ongoing right ear pain since Tuesday given ear drops without improvement. Now experiencing centralized CP onset today, worsening with movement and deep breaths. Denies injury/fall. HX PE postpartum.

## 2021-03-18 MED ORDER — NAPROXEN 500 MG PO TABS: 500.0000 mg | ORAL_TABLET | Freq: Two times a day (BID) | ORAL | 0 refills | Status: DC

## 2021-03-18 MED ORDER — AMOXICILLIN 500 MG PO CAPS: 500.0000 mg | ORAL_CAPSULE | Freq: Three times a day (TID) | ORAL | 0 refills | Status: AC

## 2021-03-18 NOTE — Discharge Instructions (Addendum)
You were evaluated in the Emergency Department and after careful evaluation, we did not find any emergent condition requiring admission or further testing in the hospital.  Your exam/testing today was overall reassuring.  Chest pain seems to be due to costochondritis.  Take the Naprosyn anti-inflammatory as directed for pain.  Your ear pain may be related to an infection.  Please take the amoxicillin antibiotic as directed.  Please return to the Emergency Department if you experience any worsening of your condition.  Thank you for allowing [place] to be a part of your care.

## 2021-03-18 NOTE — ED Notes (Signed)
Pt refused vitals 

## 2021-03-18 NOTE — ED Provider Notes (Signed)
[facility]PT [facility] Emergency Department Provider Note MRN:  [id number]  Arrival date & time: 03/18/21     Chief Complaint   Chest Pain and Otalgia   History of Present Illness   Ednamae Schiano is a 30 y.o. year-old female with a history of hypertension presenting to the ED with chief complaint of ear pain.  Pain in the right ear for 2 or 3 days.  Constant, moderate.  Denies fever.  No other cold-like symptoms.  Also having chest pain today, center of the chest, constant, worse with certain positions or movements.  Denies any shortness of breath, no dizziness or diaphoresis, no nausea vomiting.  No leg pain or swelling.  Review of Systems  A complete 10 system review of systems was obtained and all systems are negative except as noted in the HPI and PMH.   Patient's Health History    Past Medical History:  Diagnosis Date   Hypertension     Past Surgical History:  Procedure Laterality Date   CESAREAN SECTION      Family History  Problem Relation Age of Onset   Hypertension Mother    Hypertension Father    Lymphoma Paternal Grandmother    Diabetes Paternal Grandmother     Social History   Socioeconomic History   Marital status: Single    Spouse name: Not on file   Number of children: Not on file   Years of education: Not on file   Highest education level: Not on file  Occupational History   Not on file  Tobacco Use   Smoking status: Every Day    Packs/day: 0.25    Pack years: 0.00    Types: Cigarettes   Smokeless tobacco: Never  Vaping Use   Vaping Use: Every day  Substance and Sexual Activity   Alcohol use: Yes    Comment: less than once a week   Drug use: Yes    Types: Marijuana   Sexual activity: Yes    Partners: Male    Birth control/protection: None  Other Topics Concern   Not on file  Social History Narrative   Not on file   Social Determinants of Health   Financial Resource Strain: Not on file  Food Insecurity: Food  Insecurity Present   Worried About Running Out of Food in the Last Year: Sometimes true   Ran Out of Food in the Last Year: Never true  Transportation Needs: No Transportation Needs   Lack of Transportation (Medical): No   Lack of Transportation (Non-Medical): No  Physical Activity: Not on file  Stress: Not on file  Social Connections: Not on file  Intimate Partner Violence: Not on file     Physical Exam   Vitals:   03/18/21 0113 03/18/21 0454  BP: 130/86 (!) 140/92  Pulse: 65 60  Resp: 16 (!) 21  Temp:  98.3 F (36.8 C)  SpO2: 100% 100%    CONSTITUTIONAL: Well-appearing, NAD NEURO:  Alert and oriented x 3, no focal deficits EYES:  eyes equal and reactive ENT/NECK:  no LAD, no JVD CARDIO: Regular rate, well-perfused, normal S1 and S2 PULM:  CTAB no wheezing or rhonchi GI/GU:  normal bowel sounds, non-distended, non-tender MSK/SPINE:  No gross deformities, no edema SKIN:  no rash, atraumatic PSYCH:  Appropriate speech and behavior  *Additional and/or pertinent findings included in MDM below  Diagnostic and Interventional Summary    EKG Interpretation  Date/Time:  Saturday March 17 2021 19:30:55 EDT Ventricular Rate:  58  PR Interval:  158 QRS Duration: 72 QT Interval:  402 QTC Calculation: 394 R Axis:   72 Text Interpretation: Sinus bradycardia Otherwise normal ECG Confirmed by Kennis Carina [phone]) on 03/18/2021 5:04:18 AM        Labs Reviewed  BASIC METABOLIC PANEL - Abnormal; Notable for the following components:      Result Value   Glucose, Bld 100 (*)    All other components within normal limits  SARS CORONAVIRUS 2 (TAT 6-24 HRS)  CBC WITH DIFFERENTIAL/PLATELET  I-STAT BETA HCG BLOOD, ED ([facility], [facility], [facility] ONLY)  TROPONIN I (HIGH SENSITIVITY)  TROPONIN I (HIGH SENSITIVITY)    DG Chest 2 View  Final Result      Medications - No data to display   Procedures  /  Critical Care Procedures  ED Course and Medical Decision Making  I have reviewed the triage  vital signs, the nursing notes, and pertinent available records from the EMR.  Listed above are laboratory and imaging tests that I personally ordered, reviewed, and interpreted and then considered in my medical decision making (see below for details).  Chest pain favored to be due to costochondritis or chest wall related discomfort given the worsening with certain positions.  She is with normal vital signs, no shortness of breath, no evidence of DVT on exam, highly doubt PE.  No risk factors for cardiac disease.  Triage work-up is reassuring with normal EKG, chest x-ray, troponin.  Ear pain may be explained by OM, has some erythema.  Covering with amoxicillin.  Appropriate for discharge.       Elmer Sow. Pilar Plate, MD [facility] Emergency Medicine [facility] [email or web address]  Final Clinical Impressions(s) / ED Diagnoses     ICD-10-CM   1. Right ear pain  H92.01     2. Chest pain, unspecified type  R07.9       ED Discharge Orders          Ordered    amoxicillin (AMOXIL) 500 MG capsule  3 times daily        03/18/21 0510    naproxen (NAPROSYN) 500 MG tablet  2 times daily        03/18/21 0510             Discharge Instructions Discussed with and Provided to Patient:    Discharge Instructions      You were evaluated in the Emergency Department and after careful evaluation, we did not find any emergent condition requiring admission or further testing in the hospital.  Your exam/testing today was overall reassuring.  Chest pain seems to be due to costochondritis.  Take the Naprosyn anti-inflammatory as directed for pain.  Your ear pain may be related to an infection.  Please take the amoxicillin antibiotic as directed.  Please return to the Emergency Department if you experience any worsening of your condition.  Thank you for allowing [place] to be a part of your care.        Sabas Sous, MD 03/18/21 [phone]

## 2021-03-18 NOTE — ED Notes (Signed)
Pt discharged and ambulated out of the ED without difficulty. 

## 2021-04-18 ENCOUNTER — Other Ambulatory Visit: Payer: Self-pay

## 2021-04-18 ENCOUNTER — Encounter (HOSPITAL_BASED_OUTPATIENT_CLINIC_OR_DEPARTMENT_OTHER): Payer: Self-pay | Admitting: Emergency Medicine

## 2021-04-18 ENCOUNTER — Emergency Department (HOSPITAL_BASED_OUTPATIENT_CLINIC_OR_DEPARTMENT_OTHER)
Admission: EM | Admit: 2021-04-18 | Discharge: 2021-04-18 | Disposition: A | Discharge status: Home / Self Care | Payer: Medicaid Other | Attending: Emergency Medicine | Admitting: Emergency Medicine

## 2021-04-18 DIAGNOSIS — Z20822 Contact with and (suspected) exposure to covid-19: Secondary | ICD-10-CM | POA: Insufficient documentation

## 2021-04-18 DIAGNOSIS — Z8616 Personal history of COVID-19: Secondary | ICD-10-CM | POA: Insufficient documentation

## 2021-04-18 DIAGNOSIS — F17210 Nicotine dependence, cigarettes, uncomplicated: Secondary | ICD-10-CM | POA: Insufficient documentation

## 2021-04-18 DIAGNOSIS — N760 Acute vaginitis: Secondary | ICD-10-CM

## 2021-04-18 DIAGNOSIS — L02214 Cutaneous abscess of groin: Secondary | ICD-10-CM | POA: Diagnosis not present

## 2021-04-18 DIAGNOSIS — I10 Essential (primary) hypertension: Secondary | ICD-10-CM | POA: Diagnosis not present

## 2021-04-18 DIAGNOSIS — N898 Other specified noninflammatory disorders of vagina: Secondary | ICD-10-CM | POA: Insufficient documentation

## 2021-04-18 DIAGNOSIS — Z79899 Other long term (current) drug therapy: Secondary | ICD-10-CM | POA: Insufficient documentation

## 2021-04-18 DIAGNOSIS — B9689 Other specified bacterial agents as the cause of diseases classified elsewhere: Secondary | ICD-10-CM

## 2021-04-18 HISTORY — DX: Other pulmonary embolism without acute cor pulmonale: I26.99

## 2021-04-18 LAB — WET PREP, GENITAL
Sperm: NONE SEEN
Trich, Wet Prep: NONE SEEN
Yeast Wet Prep HPF POC: NONE SEEN

## 2021-04-18 MED ORDER — LIDOCAINE-EPINEPHRINE 2 %-1:100000 IJ SOLN: 20.0000 mL | Freq: Once | INTRAMUSCULAR | Status: DC

## 2021-04-18 MED ORDER — FLUCONAZOLE 150 MG PO TABS
150.0000 mg | ORAL_TABLET | Freq: Once | ORAL | Status: AC
  Administered 2021-04-18: 150 mg via ORAL
  Filled 2021-04-18: qty 1

## 2021-04-18 MED ORDER — METRONIDAZOLE 500 MG PO TABS: 500.0000 mg | ORAL_TABLET | Freq: Two times a day (BID) | ORAL | 0 refills | Status: DC

## 2021-04-18 NOTE — ED Provider Notes (Signed)
[facility] EMERGENCY DEPARTMENT Provider Note   CSN: [id number] Arrival date & time: 04/18/21  1046     History Chief Complaint  Patient presents with   Abscess    Angela Cantrell is a 30 y.o. female.  The history is provided by the patient.  Abscess Location:  Pelvis Pelvic abscess location:  Groin Size:  3 cm Abscess quality: fluctuance, induration, painful, redness and warmth   Red streaking: no   Duration:  4 days Progression:  Worsening Pain details:    Timing:  Constant   Progression:  Worsening Chronicity:  Recurrent Context: not diabetes, not injected drug use, not insect bite/sting and not skin injury   Ineffective treatments:  Warm compresses and oral antibiotics Associated symptoms: no anorexia, no fatigue, no fever, no headaches, no nausea and no vomiting   Vaginal Discharge Associated symptoms: no fever, no nausea and no vomiting       Past Medical History:  Diagnosis Date   Hypertension    Pulmonary embolism [facility])     Patient Active Problem List   Diagnosis Date Noted   Atypical squamous cells of undetermined significance on cytologic smear of cervix (ASC-US) 12/20/2020   Laryngopharyngeal reflux (LPR) 08/24/2019    Past Surgical History:  Procedure Laterality Date   CESAREAN SECTION       OB History     Gravida  4   Para  4   Term  2   Preterm  2   AB  0   Living  2      SAB  0   IAB  0   Ectopic  0   Multiple  0   Live Births  3           Family History  Problem Relation Age of Onset   Hypertension Mother    Hypertension Father    Lymphoma Paternal Grandmother    Diabetes Paternal Grandmother     Social History   Tobacco Use   Smoking status: Every Day    Packs/day: 0.25    Types: Cigarettes   Smokeless tobacco: Never  Vaping Use   Vaping Use: Every day  Substance Use Topics   Alcohol use: Yes    Comment: less than once a week   Drug use: Yes    Types: Marijuana    Home  Medications Prior to Admission medications   Medication Sig Start Date End Date Taking? Authorizing Provider  omeprazole (PRILOSEC) 10 MG capsule Take 10 mg by mouth daily.   Yes [provider]  VITAMIN D PO Take by mouth once a week.   Yes [provider]  acetaminophen (TYLENOL) 325 MG tablet Take 650 mg by mouth every 6 (six) hours as needed. Patient not taking: No sig reported    [provider]  clindamycin (CLEOCIN-T) 1 % external solution Apply topically daily. Add to shower wash daily 12/20/20   Bernerd Limbo, CNM  clindamycin (CLINDAGEL) 1 % gel Apply topically 2 (two) times daily. Apply to areas of concern twice daily 12/20/20   Bernerd Limbo, CNM  doxycycline (VIBRAMYCIN) 100 MG capsule Take 100 mg by mouth 2 (two) times daily. Patient not taking: No sig reported 08/07/20   [provider]  meloxicam (MOBIC) 15 MG tablet Take 1 tablet by mouth daily. Patient not taking: No sig reported 09/04/20   [provider]  naproxen (NAPROSYN) 500 MG tablet Take 1 tablet (500 mg total) by mouth 2 (two) times  daily. 03/18/21   Sabas Sous, MD  sulfamethoxazole-trimethoprim (BACTRIM DS) 800-160 MG tablet Take 1 tablet by mouth 2 (two) times daily. Patient not taking: No sig reported 06/27/20   [provider]    Allergies    Patient has no known allergies.  Review of Systems   Review of Systems  Constitutional:  Negative for fatigue and fever.  Gastrointestinal:  Negative for anorexia, nausea and vomiting.  Genitourinary:  Positive for vaginal discharge.  Neurological:  Negative for headaches.   Physical Exam Updated Vital Signs BP (!) 151/114   Pulse (!) 134   Temp 98.3 F (36.8 C) (Oral)   Resp 19   Ht 5\' 3"  (1.6 m)   Wt 94.3 kg   LMP 03/16/2021 (Approximate)   SpO2 (!) 82%   BMI 36.85 kg/m   Physical Exam Vitals and nursing note reviewed.  Constitutional:      General: She is not in acute distress.     Appearance: She is well-developed. She is not diaphoretic.  HENT:     Head: Normocephalic and atraumatic.     Right Ear: External ear normal.     Left Ear: External ear normal.     Nose: Nose normal.     Mouth/Throat:     Mouth: Mucous membranes are moist.  Eyes:     General: No scleral icterus.    Conjunctiva/sclera: Conjunctivae normal.  Cardiovascular:     Rate and Rhythm: Normal rate and regular rhythm.     Heart sounds: Normal heart sounds. No murmur heard.   No friction rub. No gallop.  Pulmonary:     Effort: Pulmonary effort is normal. No respiratory distress.     Breath sounds: Normal breath sounds.  Abdominal:     General: Bowel sounds are normal. There is no distension.     Palpations: Abdomen is soft. There is no mass.     Tenderness: There is no abdominal tenderness. There is no guarding.  Musculoskeletal:     Cervical back: Normal range of motion.  Skin:    General: Skin is warm and dry.     Comments: Abscess Right groin  Neurological:     Mental Status: She is alert and oriented to person, place, and time.  Psychiatric:        Behavior: Behavior normal.    ED Results / Procedures / Treatments   Labs (all labs ordered are listed, but only abnormal results are displayed) Labs Reviewed - No data to display  EKG None  Radiology No results found.  Procedures .09/01/2022Incision and Drainage  Date/Time: 04/18/2021 2:29 PM Performed by: 06/18/2021, PA-C Authorized by: Arthor Captain, PA-C   Consent:    Consent obtained:  Verbal   Consent given by:  Patient   Risks discussed:  Bleeding, incomplete drainage, pain and damage to other organs   Alternatives discussed:  No treatment Universal protocol:    Procedure explained and questions answered to patient or proxy's satisfaction: yes     Relevant documents present and verified: yes     Test results available : yes     Imaging studies available: yes     Required blood products, implants, devices, and special  equipment available: yes     Site/side marked: yes     Immediately prior to procedure, a time out was called: yes     Patient identity confirmed:  Verbally with patient Location:    Type:  Abscess   Size:  Groin Pre-procedure details:  Skin preparation:  Betadine Sedation:    Sedation type:  None Anesthesia:    Anesthesia method:  Local infiltration   Local anesthetic:  Lidocaine 1% WITH epi Procedure type:    Complexity:  Complex Procedure details:    Incision types:  Single straight   Incision depth:  Subcutaneous   Wound management:  Probed and deloculated, irrigated with saline and extensive cleaning   Drainage:  Purulent   Drainage amount:  Moderate Post-procedure details:    Procedure completion:  Tolerated well, no immediate complications   Medications Ordered in ED Medications  lidocaine-EPINEPHrine (XYLOCAINE W/EPI) 2 %-1:100000 (with pres) injection 20 mL (has no administration in time range)    ED Course  I have reviewed the triage vital signs and the nursing notes.  Pertinent labs & imaging results that were available during my care of the patient were reviewed by me and considered in my medical decision making (see chart for details).    MDM Rules/Calculators/A&P                           Patietn with abscess,vaginal dc after abx use.  Denies concern for STI.  Wet prep positive for BV will be treated with Flagyl.  I&D treated successfully.  No packing needed.  No continued antibiotics.  Patient also had exposure to COVID and wished for testing.  She may follow-up on MyChart app.  She appears otherwise appropriate for discharge at this time Final Clinical Impression(s) / ED Diagnoses Final diagnoses:  None    Rx / DC Orders ED Discharge Orders     None        Arthor Captain, PA-C 04/18/21 1856    Terald Sleeper, MD 04/19/21 [phone]

## 2021-04-18 NOTE — Discharge Instructions (Addendum)
Get help right away if: You have severe pain or bleeding. You cannot eat or drink without vomiting. You have decreased urine output. You become short of breath. You have chest pain. You cough up blood. The affected area becomes numb or starts to tingle 

## 2021-04-18 NOTE — ED Triage Notes (Signed)
Pt reports "abscess" to inner right thigh. Pt denies any fevers, n/v,chills.

## 2021-04-19 LAB — SARS CORONAVIRUS 2 (TAT 6-24 HRS): SARS Coronavirus 2: NEGATIVE

## 2021-04-23 ENCOUNTER — Other Ambulatory Visit: Payer: Self-pay

## 2021-04-23 ENCOUNTER — Ambulatory Visit (INDEPENDENT_AMBULATORY_CARE_PROVIDER_SITE_OTHER): Payer: Medicaid Other | Admitting: *Deleted

## 2021-04-23 VITALS — BP 138/92 | HR 57 | Ht 63.5 in | Wt 210.2 lb

## 2021-04-23 DIAGNOSIS — Z0189 Encounter for other specified special examinations: Secondary | ICD-10-CM

## 2021-04-23 NOTE — Progress Notes (Signed)
Patient was assessed and managed by nursing staff during this encounter. I have reviewed the chart and agree with the documentation and plan. I have also made any necessary editorial changes.  Warden Fillers, MD 04/23/2021 12:55 PM

## 2021-04-23 NOTE — Progress Notes (Signed)
Here for self swab. Upon discussion patient states went to doctor recently and told had BV, wants to make sure it is gone. Per review of medications, is taking metronidizole and we discussed this is for BV. Plan is to not check for BV again until finishes med. She will make another appointment at check out. BP elevated at 132/90. Rechecked. Advised to follow up with her PCP which is [facility] for HTN treatment.  Also phq9/ and gad 7 borderline - I offered referral to [facility] which she declined. Did mark food insecurity at times, but  not at present. States was when she had lost her job, but has job now. Notified of food market if issue in future. She voices understanding.  Dell Briner,RN

## 2021-05-28 ENCOUNTER — Ambulatory Visit (HOSPITAL_COMMUNITY)
Admission: EM | Admit: 2021-05-28 | Discharge: 2021-05-28 | Disposition: A | Discharge status: Home / Self Care | Payer: Medicaid Other | Attending: Emergency Medicine | Admitting: Emergency Medicine

## 2021-05-28 ENCOUNTER — Encounter (HOSPITAL_COMMUNITY): Payer: Self-pay

## 2021-05-28 ENCOUNTER — Other Ambulatory Visit: Payer: Self-pay

## 2021-05-28 DIAGNOSIS — S46812A Strain of other muscles, fascia and tendons at shoulder and upper arm level, left arm, initial encounter: Secondary | ICD-10-CM

## 2021-05-28 MED ORDER — METHYLPREDNISOLONE SODIUM SUCC 125 MG IJ SOLR
INTRAMUSCULAR | Status: AC
  Filled 2021-05-28: qty 2

## 2021-05-28 MED ORDER — METHYLPREDNISOLONE SODIUM SUCC 125 MG IJ SOLR
60.0000 mg | Freq: Once | INTRAMUSCULAR | Status: AC
  Administered 2021-05-28: 60 mg via INTRAMUSCULAR

## 2021-05-28 MED ORDER — CYCLOBENZAPRINE HCL 10 MG PO TABS: 10.0000 mg | ORAL_TABLET | Freq: Two times a day (BID) | ORAL | 0 refills | Status: DC | PRN

## 2021-05-28 NOTE — ED Notes (Signed)
Pt called for rooming x1 

## 2021-05-28 NOTE — Discharge Instructions (Addendum)
Your pain is most likely caused by irritation to the muscles or ligaments.   Continue use of meloxicam daily  Can use Flexeril twice a day as needed for additional comfort, be mindful this medication may make you drowsy, if this occurs you may take half the pill  You may use heating pad in 15 minute intervals as needed for additional comfort, within the first 2-3 days you may find comfort in using ice in 10-15 minutes over affected area  Begin stretching affected area daily for 10 minutes as tolerated to further loosen muscles   When lying down place pillow underneath and between knees for support  Can try sleeping without pillow on firm mattress   Practice good posture: head back, shoulders back, chest forward, pelvis back and weight distributed evenly on both legs  If pain persist after recommended treatment or reoccurs if may be beneficial to follow up with orthopedic specialist for evaluation, this doctor specializes in the bones and can manage your symptoms long-term with options such as but not limited to imaging, medications or physical therapy

## 2021-05-28 NOTE — ED Provider Notes (Signed)
[facility]    CSN: [id number] Arrival date & time: 05/28/21  1107      History   Chief Complaint Chief Complaint  Patient presents with   Shoulder Pain    Lt    HPI Angela Cantrell is a 30 y.o. female.   Patient presents with left shoulder for 1 day starting abruptly.  Worsening with movement.  Range of motion intact.  Denies precipitating event, prior injury or trauma, numbness or tingling.  States yesterday that she was doing daily activities at home such as cleaning and cooking which is part of her normal routine.  Range of motion intact.  Took 15 mg of meloxicam this morning, feels that the medicine is now starting to take effect as she has been waiting.  Past Medical History:  Diagnosis Date   Hypertension    Pulmonary embolism [facility])     Patient Active Problem List   Diagnosis Date Noted   Atypical squamous cells of undetermined significance on cytologic smear of cervix (ASC-US) 12/20/2020   Laryngopharyngeal reflux (LPR) 08/24/2019    Past Surgical History:  Procedure Laterality Date   CESAREAN SECTION      OB History     Gravida  4   Para  4   Term  2   Preterm  2   AB  0   Living  2      SAB  0   IAB  0   Ectopic  0   Multiple  0   Live Births  3            Home Medications    Prior to Admission medications   Medication Sig Start Date End Date Taking? Authorizing Provider  cyclobenzaprine (FLEXERIL) 10 MG tablet Take 1 tablet (10 mg total) by mouth 2 (two) times daily as needed for muscle spasms. 05/28/21  Yes Japneet Staggs, Elita Boone, NP  acetaminophen (TYLENOL) 325 MG tablet Take 650 mg by mouth every 6 (six) hours as needed. Patient not taking: Reported on 05/28/2021    [provider]  clindamycin (CLEOCIN-T) 1 % external solution Apply topically daily. Add to shower wash daily Patient not taking: Reported on 05/28/2021 12/20/20   Bernerd Limbo, CNM  clindamycin (CLINDAGEL) 1 % gel Apply topically 2 (two)  times daily. Apply to areas of concern twice daily Patient not taking: Reported on 05/28/2021 12/20/20   Bernerd Limbo, CNM  doxycycline (VIBRAMYCIN) 100 MG capsule Take 100 mg by mouth 2 (two) times daily. Patient not taking: Reported on 05/28/2021 08/07/20   [provider]  meloxicam (MOBIC) 15 MG tablet Take 1 tablet by mouth daily. 09/04/20   [provider]  metroNIDAZOLE (FLAGYL) 500 MG tablet Take 1 tablet (500 mg total) by mouth 2 (two) times daily. One po bid x 7 days 04/18/21   Arthor Captain, PA-C  naproxen (NAPROSYN) 500 MG tablet Take 1 tablet (500 mg total) by mouth 2 (two) times daily. Patient not taking: Reported on 05/28/2021 03/18/21   Sabas Sous, MD  omeprazole (PRILOSEC) 10 MG capsule Take 10 mg by mouth daily. Patient not taking: Reported on 05/28/2021    [provider]  sulfamethoxazole-trimethoprim (BACTRIM DS) 800-160 MG tablet Take 1 tablet by mouth 2 (two) times daily. Patient not taking: Reported on 05/28/2021 06/27/20   [provider]  VITAMIN D PO Take by mouth once a week.    [provider]    Family History Family History  Problem  Relation Age of Onset   Hypertension Mother    Hypertension Father    Lymphoma Paternal Grandmother    Diabetes Paternal Grandmother     Social History Social History   Tobacco Use   Smoking status: Every Day    Packs/day: 0.25    Types: Cigarettes   Smokeless tobacco: Never  Vaping Use   Vaping Use: Every day  Substance Use Topics   Alcohol use: Yes    Comment: less than once a week   Drug use: Yes    Types: Marijuana     Allergies   Patient has no known allergies.   Review of Systems Review of Systems  Constitutional: Negative.   Respiratory: Negative.    Cardiovascular: Negative.   Skin: Negative.   Neurological: Negative.     Physical Exam Triage Vital Signs ED Triage Vitals  Enc Vitals Group     BP 05/28/21 1210 118/82     Pulse Rate 05/28/21 1210  60     Resp 05/28/21 1210 14     Temp 05/28/21 1210 98.4 F (36.9 C)     Temp Source 05/28/21 1210 Oral     SpO2 05/28/21 1210 98 %     Weight 05/28/21 1213 208 lb (94.3 kg)     Height 05/28/21 1213 5\' 2"  (1.575 m)     Head Circumference --      Peak Flow --      Pain Score 05/28/21 1213 4     Pain Loc --      Pain Edu? --      Excl. in GC? --    No data found.  Updated Vital Signs BP 118/82 (BP Location: Left Arm)   Pulse 60   Temp 98.4 F (36.9 C) (Oral)   Resp 14   Ht 5\' 2"  (1.575 m)   Wt 208 lb (94.3 kg)   SpO2 98%   BMI 38.04 kg/m   Visual Acuity Right Eye Distance:   Left Eye Distance:   Bilateral Distance:    Right Eye Near:   Left Eye Near:    Bilateral Near:     Physical Exam Constitutional:      Appearance: Normal appearance.  HENT:     Head: Normocephalic.  Eyes:     Extraocular Movements: Extraocular movements intact.  Pulmonary:     Effort: Pulmonary effort is normal.  Musculoskeletal:     Comments: Tenderness over the left trapezius muscle following the shoulder blade, no swelling or erythema present, no spasms, no tenderness throughout shoulder, range of motion of shoulder intact  Skin:    General: Skin is warm and dry.  Neurological:     Mental Status: She is alert and oriented to person, place, and time. Mental status is at baseline.  Psychiatric:        Mood and Affect: Mood normal.        Behavior: Behavior normal.     UC Treatments / Results  Labs (all labs ordered are listed, but only abnormal results are displayed) Labs Reviewed - No data to display  EKG   Radiology No results found.  Procedures Procedures (including critical care time)  Medications Ordered in UC Medications  methylPREDNISolone sodium succinate (SOLU-MEDROL) 125 mg/2 mL injection 60 mg (has no administration in time range)    Initial Impression / Assessment and Plan / UC Course  I have reviewed the triage vital signs and the nursing  notes.  Pertinent labs & imaging results that were  available during my care of the patient were reviewed by me and considered in my medical decision making (see chart for details).  Strain of left trapezius muscle  Methylprednisolone 60 mg IM now  Flexeril 10 mg bid prn Continue use of meloxicam daily  Advised pillows for support, heating pad prn and daily stretching PCP orthopedic follow-up for persistent or reoccurring symptoms Final Clinical Impressions(s) / UC Diagnoses   Final diagnoses:  Strain of left trapezius muscle, initial encounter     Discharge Instructions      Your pain is most likely caused by irritation to the muscles or ligaments.   Continue use of meloxicam daily  Can use Flexeril twice a day as needed for additional comfort, be mindful this medication may make you drowsy, if this occurs you may take half the pill  You may use heating pad in 15 minute intervals as needed for additional comfort, within the first 2-3 days you may find comfort in using ice in 10-15 minutes over affected area  Begin stretching affected area daily for 10 minutes as tolerated to further loosen muscles   When lying down place pillow underneath and between knees for support  Can try sleeping without pillow on firm mattress   Practice good posture: head back, shoulders back, chest forward, pelvis back and weight distributed evenly on both legs  If pain persist after recommended treatment or reoccurs if may be beneficial to follow up with orthopedic specialist for evaluation, this doctor specializes in the bones and can manage your symptoms long-term with options such as but not limited to imaging, medications or physical therapy      ED Prescriptions     Medication Sig Dispense Auth. Provider   cyclobenzaprine (FLEXERIL) 10 MG tablet Take 1 tablet (10 mg total) by mouth 2 (two) times daily as needed for muscle spasms. 20 tablet Valinda Hoar, NP      PDMP not reviewed  this encounter.   Valinda Hoar, NP 05/28/21 1249

## 2021-05-28 NOTE — ED Triage Notes (Signed)
Pt presents w/ c/o chest pain that occurred last week but has since resolved. Pt states at this tim her left shoulder has been painful with breathing or coughing. Pt states she has taken meloxicam PTA that has not taken effect.

## 2021-10-01 ENCOUNTER — Ambulatory Visit (HOSPITAL_COMMUNITY)
Admission: EM | Admit: 2021-10-01 | Discharge: 2021-10-01 | Disposition: A | Discharge status: Home / Self Care | Payer: Medicaid Other | Attending: Urgent Care | Admitting: Urgent Care

## 2021-10-01 ENCOUNTER — Other Ambulatory Visit: Payer: Self-pay

## 2021-10-01 ENCOUNTER — Encounter (HOSPITAL_COMMUNITY): Payer: Self-pay | Admitting: Emergency Medicine

## 2021-10-01 DIAGNOSIS — Z3201 Encounter for pregnancy test, result positive: Secondary | ICD-10-CM

## 2021-10-01 DIAGNOSIS — R141 Gas pain: Secondary | ICD-10-CM | POA: Diagnosis not present

## 2021-10-01 DIAGNOSIS — Z3A01 Less than 8 weeks gestation of pregnancy: Secondary | ICD-10-CM

## 2021-10-01 LAB — POCT URINALYSIS DIPSTICK, ED / UC
Bilirubin Urine: NEGATIVE
Glucose, UA: NEGATIVE mg/dL
Ketones, ur: NEGATIVE mg/dL
Leukocytes,Ua: NEGATIVE
Nitrite: NEGATIVE
Protein, ur: NEGATIVE mg/dL
Specific Gravity, Urine: 1.025 (ref 1.005–1.030)
Urobilinogen, UA: 0.2 mg/dL (ref 0.0–1.0)
pH: 6 (ref 5.0–8.0)

## 2021-10-01 LAB — POC URINE PREG, ED: Preg Test, Ur: POSITIVE — AB

## 2021-10-01 MED ORDER — SIMETHICONE 80 MG PO CHEW: 80.0000 mg | CHEWABLE_TABLET | Freq: Four times a day (QID) | ORAL | 0 refills | Status: DC | PRN

## 2021-10-01 MED ORDER — COMPLETENATE 29-1 MG PO CHEW: 1.0000 | CHEWABLE_TABLET | Freq: Every day | ORAL | 0 refills | Status: AC

## 2021-10-01 NOTE — ED Provider Notes (Signed)
[place]    CSN: [id number] Arrival date & time: 10/01/21  1007      History   Chief Complaint Chief Complaint  Patient presents with   Abdominal Pain    RLQ    HPI Margeaux Massie is a 31 y.o. female.   Pleasant 31 year old female presents today with concerns of right-sided abdominal discomfort starting while at work this morning.  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  She states certain movements cause it to shoot upwards, but denies any pelvic pain.  She states she is actively trying to conceive, her last menstrual period was December 21.  She is not currently on birth control.  She has had 4 prior pregnancies, she currently has a 54 and 87 year old child, her last 2 pregnancies in 2016 and 2017 were miscarriages.  She admits to feeling slightly bloated, but denies passing gas.  She denies any nausea or vomiting.  She states that the discomfort is slightly below her right rib cage.  She denies any pelvic pain.  She denies any dysuria, flank pain, or vaginal discharge.  She denies hematuria.  She denies any known history of nephrolithiasis.  She denies any injury or trauma to her abdomen or back.  She is not taking anything for it.  Her only Rx medication is vitamin D.    Abdominal Pain Associated symptoms: no constipation, no diarrhea, no nausea and no vomiting    Past Medical History:  Diagnosis Date   Hypertension    Pulmonary embolism [facility]     Patient Active Problem List   Diagnosis Date Noted   Atypical squamous cells of undetermined significance on cytologic smear of cervix (ASC-US) 12/20/2020   Laryngopharyngeal reflux (LPR) 08/24/2019    Past Surgical History:  Procedure Laterality Date   CESAREAN SECTION      OB History     Gravida  4   Para  4   Term  2   Preterm  2   AB  0   Living  2      SAB  0   IAB  0   Ectopic  0   Multiple  0   Live Births  3             Home Medications    Prior to Admission medications   Medication Sig Start Date End Date Taking? Authorizing Provider  prenatal vitamin w/FE, FA (NATACHEW) 29-1 MG CHEW chewable tablet Chew 1 tablet by mouth daily. 10/01/21 12/30/21 Yes Anokhi Shannon L, PA  simethicone (GAS-X) 80 MG chewable tablet Chew 1 tablet (80 mg total) by mouth every 6 (six) hours as needed for up to 10 doses (gas pain). 10/01/21  Yes Sennie Borden L, PA  acetaminophen (TYLENOL) 325 MG tablet Take 650 mg by mouth every 6 (six) hours as needed. Patient not taking: Reported on 05/28/2021    [provider]  clindamycin (CLEOCIN-T) 1 % external solution Apply topically daily. Add to shower wash daily Patient not taking: Reported on 05/28/2021 12/20/20   Gabriel Carina, CNM  clindamycin (CLINDAGEL) 1 % gel Apply topically 2 (two) times daily. Apply to areas of concern twice daily Patient not taking: Reported on 05/28/2021 12/20/20   Gabriel Carina, CNM  cyclobenzaprine (FLEXERIL) 10 MG tablet Take 1 tablet (10 mg total) by mouth 2 (two) times daily as needed for muscle spasms. 05/28/21   Hans Eden, NP  doxycycline (VIBRAMYCIN) 100 MG capsule Take 100 mg by mouth 2 (two) times daily. Patient not taking: Reported on 05/28/2021 08/07/20   [provider]  meloxicam (MOBIC) 15 MG tablet Take 1 tablet by mouth daily. 09/04/20   [provider]  metroNIDAZOLE (FLAGYL) 500 MG tablet Take 1 tablet (500 mg total) by mouth 2 (two) times daily. One po bid x 7 days 04/18/21   Arthor CaptainHarris, Abigail, PA-C  naproxen (NAPROSYN) 500 MG tablet Take 1 tablet (500 mg total) by mouth 2 (two) times daily. Patient not taking: Reported on 05/28/2021 03/18/21   Sabas SousBero, Michael M, MD  omeprazole (PRILOSEC) 10 MG capsule Take 10 mg by mouth daily. Patient not taking: Reported on 05/28/2021    [provider]  sulfamethoxazole-trimethoprim (BACTRIM DS) 800-160 MG tablet Take 1 tablet by mouth 2 (two) times  daily. Patient not taking: Reported on 05/28/2021 06/27/20   [provider]  VITAMIN D PO Take by mouth once a week.    [provider]    Family History Family History  Problem Relation Age of Onset   Hypertension Mother    Hypertension Father    Lymphoma Paternal Grandmother    Diabetes Paternal Grandmother     Social History Social History   Tobacco Use   Smoking status: Every Day    Packs/day: 0.25    Types: Cigarettes   Smokeless tobacco: Never  Vaping Use   Vaping Use: Every day  Substance Use Topics   Alcohol use: Yes    Comment: less than once a week   Drug use: Yes    Types: Marijuana     Allergies   Patient has no known allergies.   Review of Systems Review of Systems  Constitutional: Negative.   HENT: Negative.    Eyes: Negative.   Respiratory: Negative.    Gastrointestinal:  Positive for abdominal pain (R sided, just inferior to R rib cage). Negative for blood in stool, constipation, diarrhea, nausea and vomiting.  Endocrine: Negative.   Genitourinary: Negative.   Musculoskeletal: Negative.   Hematological: Negative.     Physical Exam Triage Vital Signs ED Triage Vitals  Enc Vitals Group     BP 10/01/21 1104 (!) 135/95     Pulse Rate 10/01/21 1104 65     Resp 10/01/21 1104 16     Temp 10/01/21 1104 98 F (36.7 C)     Temp Source 10/01/21 1104 Oral     SpO2 10/01/21 1104 99 %     Weight --      Height --      Head Circumference --      Peak Flow --      Pain Score 10/01/21 1103 8     Pain Loc --      Pain Edu? --      Excl. in GC? --    No data found.  Updated Vital Signs BP (!) 135/95 (BP Location: Right Arm)    Pulse 65    Temp 98 F (36.7 C) (Oral)    Resp 16    LMP 09/05/2021    SpO2 99%   Visual Acuity Right Eye Distance:   Left Eye Distance:   Bilateral Distance:    Right Eye Near:   Left Eye Near:    Bilateral Near:     Physical Exam Vitals and nursing note reviewed.  Constitutional:       General: She is not in acute distress.    Appearance: She is  well-developed. She is obese. She is not ill-appearing, toxic-appearing or diaphoretic.  HENT:     Head: Normocephalic and atraumatic.     Mouth/Throat:     Mouth: Mucous membranes are moist.     Pharynx: No pharyngeal swelling.  Eyes:     Extraocular Movements: Extraocular movements intact.  Cardiovascular:     Rate and Rhythm: Normal rate and regular rhythm.     Heart sounds: No murmur heard. Pulmonary:     Effort: Pulmonary effort is normal. No respiratory distress.     Breath sounds: Normal breath sounds. No wheezing.  Abdominal:     General: Abdomen is flat. Bowel sounds are normal. There is no distension. There are no signs of injury.     Palpations: Abdomen is soft. There is no hepatomegaly or splenomegaly.     Tenderness: There is no abdominal tenderness. There is no right CVA tenderness, left CVA tenderness, guarding or rebound. Negative signs include Murphy's sign, Rovsing's sign, McBurney's sign, psoas sign and obturator sign.     Comments: I was unable to elicit any discomfort to both deep palpation or percussion of the abdomen. Her stated location of discomfort was NOT in the pelvis. No signs of acute abdomen or infection. NEGATIVE murphy sign.  Neurological:     Mental Status: She is alert.     UC Treatments / Results  Labs (all labs ordered are listed, but only abnormal results are displayed) Labs Reviewed  POCT URINALYSIS DIPSTICK, ED / UC - Abnormal; Notable for the following components:      Result Value   Hgb urine dipstick TRACE (*)    All other components within normal limits  POC URINE PREG, ED - Abnormal; Notable for the following components:   Preg Test, Ur POSITIVE (*)    All other components within normal limits    EKG   Radiology No results found.  Procedures Procedures (including critical care time)  Medications Ordered in UC Medications - No data to display  Initial Impression /  Assessment and Plan / UC Course  I have reviewed the triage vital signs and the nursing notes.  Pertinent labs & imaging results that were available during my care of the patient were reviewed by me and considered in my medical decision making (see chart for details).     Abdominal pain - suspect gas pains based upon concurrent pregnancy and location. NO signs of acute abdomen. Her discomfort is up far too much to have concerns of an ectopic pregnancy. NO pain noted to palpation of adnexa bilaterally. It is possible that it could be a kidney stone as well, but given pregnancy KUB not obtained. Simethicone is safe for use in pregnancy - recommended supportive care initially, take only if sx persist. Pregnancy - pt has an OB through [facility]. Call and schedule an appointment. If RUQ pain does not improve or resolve, or more importantly if it worsens, head to ER For STAT US.   Final Clinical Impressions(s) / UC Diagnoses   Final diagnoses:  Gas pain  Less than [redacted] weeks gestation of pregnancy     Discharge Instructions      Congratulations on your pregnancy. Please start taking a prenatal vitamin daily. Please call your OB to schedule an office visit. You may try simethicone for your discomfort, if it does not help or if any worsening symptoms, please go to the nearest ER.     ED Prescriptions     Medication Sig Dispense Auth. Provider  prenatal vitamin w/FE, FA (NATACHEW) 29-1 MG CHEW chewable tablet Chew 1 tablet by mouth daily. 90 tablet Jayant Kriz L, PA   simethicone (GAS-X) 80 MG chewable tablet Chew 1 tablet (80 mg total) by mouth every 6 (six) hours as needed for up to 10 doses (gas pain). 10 tablet Lamara Brecht L, [place]      PDMP not reviewed this encounter.   Chaney Malling, [place] 10/01/21 1146

## 2021-10-01 NOTE — Discharge Instructions (Addendum)
Congratulations on your pregnancy. Please start taking a prenatal vitamin daily. Please call your OB to schedule an office visit. You may try simethicone for your discomfort, if it does not help or if any worsening symptoms, please go to the nearest ER.

## 2021-10-01 NOTE — ED Triage Notes (Signed)
Pt presents with RLQ pain that started this am. Denies N,V,or D. States with some movements causes shooting pain upward.

## 2021-10-02 ENCOUNTER — Other Ambulatory Visit: Payer: Self-pay

## 2021-10-02 ENCOUNTER — Encounter (HOSPITAL_BASED_OUTPATIENT_CLINIC_OR_DEPARTMENT_OTHER): Payer: Self-pay

## 2021-10-02 ENCOUNTER — Emergency Department (HOSPITAL_BASED_OUTPATIENT_CLINIC_OR_DEPARTMENT_OTHER): Payer: BC Managed Care – PPO

## 2021-10-02 ENCOUNTER — Emergency Department (HOSPITAL_BASED_OUTPATIENT_CLINIC_OR_DEPARTMENT_OTHER)
Admission: EM | Admit: 2021-10-02 | Discharge: 2021-10-02 | Disposition: A | Discharge status: Home / Self Care | Payer: BC Managed Care – PPO | Attending: Emergency Medicine | Admitting: Emergency Medicine

## 2021-10-02 DIAGNOSIS — Z3490 Encounter for supervision of normal pregnancy, unspecified, unspecified trimester: Secondary | ICD-10-CM

## 2021-10-02 DIAGNOSIS — R1033 Periumbilical pain: Secondary | ICD-10-CM | POA: Diagnosis not present

## 2021-10-02 DIAGNOSIS — R1031 Right lower quadrant pain: Secondary | ICD-10-CM | POA: Diagnosis present

## 2021-10-02 DIAGNOSIS — O26899 Other specified pregnancy related conditions, unspecified trimester: Secondary | ICD-10-CM

## 2021-10-02 DIAGNOSIS — R102 Pelvic and perineal pain: Secondary | ICD-10-CM | POA: Insufficient documentation

## 2021-10-02 LAB — URINALYSIS, ROUTINE W REFLEX MICROSCOPIC
Bilirubin Urine: NEGATIVE
Glucose, UA: NEGATIVE mg/dL
Hgb urine dipstick: NEGATIVE
Ketones, ur: NEGATIVE mg/dL
Leukocytes,Ua: NEGATIVE
Nitrite: NEGATIVE
Protein, ur: NEGATIVE mg/dL
Specific Gravity, Urine: >1.03 — ABNORMAL HIGH (ref 1.005–1.030)
pH: 6 (ref 5.0–8.0)

## 2021-10-02 LAB — CBC WITH DIFFERENTIAL/PLATELET
Abs Immature Granulocytes: 0.02 10*3/uL (ref 0.00–0.07)
Basophils Absolute: 0 10*3/uL (ref 0.0–0.1)
Basophils Relative: 1 %
Eosinophils Absolute: 0.1 10*3/uL (ref 0.0–0.5)
Eosinophils Relative: 1 %
HCT: 40.3 % (ref 36.0–46.0)
Hemoglobin: 14 g/dL (ref 12.0–15.0)
Immature Granulocytes: 0 %
Lymphocytes Relative: 39 %
Lymphs Abs: 2.4 10*3/uL (ref 0.7–4.0)
MCH: 31.7 pg (ref 26.0–34.0)
MCHC: 34.7 g/dL (ref 30.0–36.0)
MCV: 91.2 fL (ref 80.0–100.0)
Monocytes Absolute: 0.5 10*3/uL (ref 0.1–1.0)
Monocytes Relative: 8 %
Neutro Abs: 3.2 10*3/uL (ref 1.7–7.7)
Neutrophils Relative %: 51 %
Platelets: 279 10*3/uL (ref 150–400)
RBC: 4.42 MIL/uL (ref 3.87–5.11)
RDW: 12.9 % (ref 11.5–15.5)
WBC: 6.3 10*3/uL (ref 4.0–10.5)
nRBC: 0 % (ref 0.0–0.2)

## 2021-10-02 LAB — COMPREHENSIVE METABOLIC PANEL
ALT: 14 U/L (ref 0–44)
AST: 17 U/L (ref 15–41)
Albumin: 3.9 g/dL (ref 3.5–5.0)
Alkaline Phosphatase: 50 U/L (ref 38–126)
Anion gap: 7 (ref 5–15)
BUN: 10 mg/dL (ref 6–20)
CO2: 23 mmol/L (ref 22–32)
Calcium: 8.7 mg/dL — ABNORMAL LOW (ref 8.9–10.3)
Chloride: 105 mmol/L (ref 98–111)
Creatinine, Ser: 0.83 mg/dL (ref 0.44–1.00)
GFR, Estimated: >60 mL/min (ref >60)
Glucose, Bld: 96 mg/dL (ref 70–99)
Potassium: 3.8 mmol/L (ref 3.5–5.1)
Sodium: 135 mmol/L (ref 135–145)
Total Bilirubin: 0.6 mg/dL (ref 0.3–1.2)
Total Protein: 7.1 g/dL (ref 6.5–8.1)

## 2021-10-02 LAB — LIPASE, BLOOD: Lipase: 30 U/L (ref 11–51)

## 2021-10-02 LAB — HCG, QUANTITATIVE, PREGNANCY: hCG, Beta Chain, Quant, S: 426 m[IU]/mL — ABNORMAL HIGH (ref <5)

## 2021-10-02 LAB — WET PREP, GENITAL
Clue Cells Wet Prep HPF POC: NONE SEEN
Sperm: NONE SEEN
Trich, Wet Prep: NONE SEEN
WBC, Wet Prep HPF POC: <10 (ref <10)
Yeast Wet Prep HPF POC: NONE SEEN

## 2021-10-02 NOTE — ED Triage Notes (Signed)
Patient presents with complaint of RLQ abdominal pain x1 day.  Pain worse with movement and lying on R side.  Denies nausea, vomiting or diarrhea.

## 2021-10-02 NOTE — ED Notes (Signed)
Chaperoned ED provider during pelvic exam

## 2021-10-02 NOTE — ED Provider Notes (Signed)
Patient with positive pregnancy test.  Lower abdominal pain.  On my evaluation has no tenderness.  Very well-appearing.  Ultrasound was ordered of her abdomen and uterus to evaluate for appendicitis, ectopic pregnancy.  Overall ultrasounds are unremarkable.  There is no definitive intrauterine pregnancy.  Cannot fully rule out appendicitis on ultrasound but clinically have no suspicion for.  She appears very well.  She understands return precautions.  She understands to follow-up with OB/GYN in 48 hours for recheck of her pregnancy hormone.  She understands return if she develops more severe pain, vaginal bleeding.  Discharged in good condition.  This chart was dictated using voice recognition software.  Despite best efforts to proofread,  errors can occur which can change the documentation meaning.    Lennice Sites, DO 10/02/21 [phone]

## 2021-10-02 NOTE — ED Provider Notes (Signed)
[facility]Y DEPARTMENT Provider Note   CSN: [id number] Arrival date & time: 10/02/21  0500     History  Chief Complaint  Patient presents with   Abdominal Pain    RLQ    Angela Cantrell is a 31 y.o. female.  Patient with approximately 24 hours of right-sided mid and lower abdominal pain.  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  Denies any fall or trauma.  Pain is to her right lower abdomen and middle abdomen.  She went to urgent care yesterday and was found to be pregnant.  She estimates she is about 4 to 5 weeks.  This is her fifth pregnancy.  She has a history of a miscarriage as well as a placental abruption in the past.  She has 2 living children.  Her pain is constant to her lower abdomen.  It is worse with palpation and movement.  No nausea, vomiting or diarrhea.  No vaginal bleeding or discharge.  No chest pain or shortness of breath.  No pain with urination or blood in the urine.  Still has appendix and gallbladder. She was told to urgent care and that the pain could be "gas pain". States the pain has not moved in the location since she left the urgent care.  Still has an appetite  The history is provided by the patient.  Abdominal Pain Associated symptoms: no cough, no dysuria, no fever, no hematuria, no nausea, no shortness of breath, no vaginal bleeding, no vaginal discharge and no vomiting       Home Medications Prior to Admission medications   Medication Sig Start Date End Date Taking? Authorizing Provider  prenatal vitamin w/FE, FA (NATACHEW) 29-1 MG CHEW chewable tablet Chew 1 tablet by mouth daily. 10/01/21 12/30/21  Crain, Loree Fee L, PA  simethicone (GAS-X) 80 MG chewable tablet Chew 1 tablet (80 mg total) by mouth every 6 (six) hours as needed for up to 10 doses (gas pain). 10/01/21   Crain, Whitney L, PA  VITAMIN D PO Take by mouth once a week.    [provider]      Allergies    Patient has no  known allergies.    Review of Systems   Review of Systems  Constitutional:  Negative for activity change, appetite change and fever.  HENT:  Negative for congestion.   Respiratory:  Negative for cough, chest tightness and shortness of breath.   Gastrointestinal:  Positive for abdominal pain. Negative for nausea and vomiting.  Genitourinary:  Negative for dysuria, hematuria, urgency, vaginal bleeding and vaginal discharge.  Musculoskeletal:  Negative for arthralgias, back pain and myalgias.  Skin:  Negative for rash.  Neurological:  Negative for dizziness, weakness and headaches.   all other systems are negative except as noted in the HPI and PMH.   Physical Exam Updated Vital Signs BP (!) 132/91 (BP Location: Right Arm)    Pulse 65    Temp 98.1 F (36.7 C) (Oral)    Resp 16    Ht 5\' 2"  (1.575 m)    Wt 95.7 kg    LMP 09/05/2021    SpO2 99%    BMI 38.59 kg/m  Physical Exam Vitals and nursing note reviewed.  Constitutional:      General: She is not in acute distress.    Appearance: She is well-developed.  HENT:     Head: Normocephalic and atraumatic.     Mouth/Throat:     Pharynx: No oropharyngeal  exudate.  Eyes:     Conjunctiva/sclera: Conjunctivae normal.     Pupils: Pupils are equal, round, and reactive to light.  Neck:     Comments: No meningismus. Cardiovascular:     Rate and Rhythm: Normal rate and regular rhythm.     Heart sounds: Normal heart sounds. No murmur heard. Pulmonary:     Effort: Pulmonary effort is normal. No respiratory distress.     Breath sounds: Normal breath sounds.  Abdominal:     Palpations: Abdomen is soft.     Tenderness: There is abdominal tenderness. There is no guarding or rebound.     Comments: Soft abdomen, periumbilical and right lower quadrant tenderness, no guarding or rebound. No upper abdominal tenderness. Negative Murphy sign.  Genitourinary:    Comments: Chaperone present, Musician. Normal external genitalia.  No blood in vaginal  vault.  No CMT.  No lateralizing adnexal pain. Musculoskeletal:        General: No tenderness. Normal range of motion.     Cervical back: Normal range of motion and neck supple.     Comments: No CVAT  Skin:    General: Skin is warm.  Neurological:     Mental Status: She is alert and oriented to person, place, and time.     Cranial Nerves: No cranial nerve deficit.     Motor: No abnormal muscle tone.     Coordination: Coordination normal.     Comments:  5/5 strength throughout. CN 2-12 intact.Equal grip strength.   Psychiatric:        Behavior: Behavior normal.    ED Results / Procedures / Treatments   Labs (all labs ordered are listed, but only abnormal results are displayed) Labs Reviewed  COMPREHENSIVE METABOLIC PANEL - Abnormal; Notable for the following components:      Result Value   Calcium 8.7 (*)    All other components within normal limits  HCG, QUANTITATIVE, PREGNANCY - Abnormal; Notable for the following components:   hCG, Beta Chain, Quant, S 426 (*)    All other components within normal limits  URINALYSIS, ROUTINE W REFLEX MICROSCOPIC - Abnormal; Notable for the following components:   Specific Gravity, Urine >1.030 (*)    All other components within normal limits  WET PREP, GENITAL  CBC WITH DIFFERENTIAL/PLATELET  LIPASE, BLOOD  GC/CHLAMYDIA PROBE AMP ([place]) NOT AT [facility]    EKG None  Radiology No results found.  Procedures Procedures    Medications Ordered in ED Medications - No data to display  ED Course/ Medical Decision Making/ A&P                           Medical Decision Making Amount and/or Complexity of Data Reviewed Labs: ordered. Decision-making details documented in ED Course. Radiology: ordered and independent interpretation performed. ECG/medicine tests: ordered and independent interpretation performed.  1 day of right lower abdominal pain without associated symptoms.  Found to be pregnant yesterday.  Abdomen soft without  peritoneal signs.  Urinalysis negative for blood or infection.  hCG 426. No leukocytosis.  Abdomen soft without peritoneal signs.  No fever.  No pain to the Burney's point.  Ultrasound be obtained to assess for possible ectopic pregnancy.  However her hCG is quite low so ultrasound may not show any pathology.  We will also attempt to evaluate ovary and appendix. Low clinical special for appendicitis at this time given her normal appetite, no leukocytosis, no fever, no peritoneal signs  With normal appetite without vomiting.  Care to be transferred at shift change with ultrasound pending. Dr. Ronnald Nian to assume care.        Final Clinical Impression(s) / ED Diagnoses Final diagnoses:  Pelvic pain affecting pregnancy    Rx / DC Orders ED Discharge Orders     None         Samuele Storey, Annie Main, MD 10/02/21 [phone]

## 2021-10-02 NOTE — Discharge Instructions (Signed)
Your pregnancy test was positive today.  However you are too early in your pregnancy to see a pregnancy in your uterus.  Please follow-up with OB/GYN and your primary care doctor to have your pregnancy hormone level checked in 48 hours.  Please return to emergency department as discussed if you develop more severe abdominal pain, vaginal bleeding.  Continue to take prenatal vitamins.

## 2021-10-03 LAB — GC/CHLAMYDIA PROBE AMP (~~LOC~~) NOT AT ARMC
Chlamydia: NEGATIVE
Comment: NEGATIVE
Comment: NORMAL
Neisseria Gonorrhea: NEGATIVE

## 2021-10-04 ENCOUNTER — Other Ambulatory Visit: Payer: Self-pay

## 2021-10-04 ENCOUNTER — Emergency Department (HOSPITAL_BASED_OUTPATIENT_CLINIC_OR_DEPARTMENT_OTHER)
Admission: EM | Admit: 2021-10-04 | Discharge: 2021-10-04 | Disposition: A | Discharge status: Home / Self Care | Payer: BC Managed Care – PPO | Attending: Emergency Medicine | Admitting: Emergency Medicine

## 2021-10-04 ENCOUNTER — Encounter (HOSPITAL_BASED_OUTPATIENT_CLINIC_OR_DEPARTMENT_OTHER): Payer: Self-pay | Admitting: *Deleted

## 2021-10-04 DIAGNOSIS — L02214 Cutaneous abscess of groin: Secondary | ICD-10-CM | POA: Insufficient documentation

## 2021-10-04 DIAGNOSIS — L0291 Cutaneous abscess, unspecified: Secondary | ICD-10-CM

## 2021-10-04 DIAGNOSIS — Z3201 Encounter for pregnancy test, result positive: Secondary | ICD-10-CM | POA: Diagnosis present

## 2021-10-04 DIAGNOSIS — N9489 Other specified conditions associated with female genital organs and menstrual cycle: Secondary | ICD-10-CM | POA: Diagnosis not present

## 2021-10-04 LAB — URINALYSIS, ROUTINE W REFLEX MICROSCOPIC
Bilirubin Urine: NEGATIVE
Glucose, UA: NEGATIVE mg/dL
Hgb urine dipstick: NEGATIVE
Ketones, ur: NEGATIVE mg/dL
Leukocytes,Ua: NEGATIVE
Nitrite: NEGATIVE
Protein, ur: NEGATIVE mg/dL
Specific Gravity, Urine: 1.025 (ref 1.005–1.030)
pH: 7 (ref 5.0–8.0)

## 2021-10-04 LAB — PREGNANCY, URINE: Preg Test, Ur: POSITIVE — AB

## 2021-10-04 LAB — HCG, QUANTITATIVE, PREGNANCY: hCG, Beta Chain, Quant, S: 994 m[IU]/mL — ABNORMAL HIGH (ref <5)

## 2021-10-04 MED ORDER — CEPHALEXIN 500 MG PO CAPS: 500.0000 mg | ORAL_CAPSULE | Freq: Two times a day (BID) | ORAL | 0 refills | Status: AC

## 2021-10-04 NOTE — ED Notes (Signed)
Spoke with Maggie in lab to add on urinalysis

## 2021-10-04 NOTE — ED Notes (Signed)
Pt discharged to home. Discharge instructions have been discussed with patient and/or family members. Pt verbally acknowledges understanding d/c instructions, and endorses comprehension to checkout at registration before leaving.  °

## 2021-10-04 NOTE — ED Triage Notes (Signed)
Here for follow up lab work and then to follow up with OB/GYN on a later date

## 2021-10-04 NOTE — ED Notes (Signed)
Pt endorses "boil" on vagina, requests to have drained. EDP aware

## 2021-10-04 NOTE — ED Notes (Signed)
Pt ambulatory with steady gait to bathroom

## 2021-10-04 NOTE — ED Notes (Signed)
ED Provider at bedside. 

## 2021-10-04 NOTE — ED Notes (Signed)
Chaperone for EDP for exam. Pt tolerated well. Abscess located proximally on RLE

## 2021-10-04 NOTE — ED Provider Notes (Addendum)
[facility] EMERGENCY DEPARTMENT Provider Note   CSN: [id number] Arrival date & time: 10/04/21  [id number]     History  Chief Complaint  Patient presents with   Possible Pregnancy    Angela Cantrell is a 31 y.o. female.  HPI  Patient with medical history including hypertension, 4 previous pregnancies presents to the emergency department with complaints of right lower quadrant pain.  Patient states this pain started a couple days ago, came on suddenly, states the pain is intermittent, has increased urinary frequency but denies dysuria hematuria vaginal discharge, vaginal bleeding, she denies any flank tenderness, no associated nausea, vomiting, diarrhea to states she is slightly constipated she denies any hematochezia or melena, she has no significant abdominal history, has had C-section in the past, no other abdominal surgeries.  Patient states she has no pain at this time, she states pain only come on with certain movements, patient states that her last 2 pregnancies she had a placenta abruptio.  She states that she was seen here 2 days ago and was welcome back for reevaluation.  After reviewing patient's charts patient seen here on the 17th, there is concerns of appendicitis versus IUP of unknown origins, ultrasound was obtained negative for acute findings, hCG quant's was 415 she was advised to come back in 2 days time for reevaluation.  Home Medications Prior to Admission medications   Medication Sig Start Date End Date Taking? Authorizing Provider  cephALEXin (KEFLEX) 500 MG capsule Take 1 capsule (500 mg total) by mouth 2 (two) times daily for 7 days. 10/04/21 10/11/21 Yes Marcello Fennel, PA-C  prenatal vitamin w/FE, FA (NATACHEW) 29-1 MG CHEW chewable tablet Chew 1 tablet by mouth daily. 10/01/21 12/30/21  Crain, Loree Fee L, PA  simethicone (GAS-X) 80 MG chewable tablet Chew 1 tablet (80 mg total) by mouth every 6 (six) hours as needed for up to 10 doses (gas pain).  10/01/21   Crain, Whitney L, PA  VITAMIN D PO Take by mouth once a week.    [provider]      Allergies    Patient has no known allergies.    Review of Systems   Review of Systems  Constitutional:  Negative for chills and fever.  Respiratory:  Negative for shortness of breath.   Cardiovascular:  Negative for chest pain.  Gastrointestinal:  Negative for abdominal pain, diarrhea and vomiting.  Genitourinary:  Positive for frequency. Negative for flank pain, vaginal bleeding, vaginal discharge and vaginal pain.  Neurological:  Negative for headaches.   Physical Exam Updated Vital Signs BP (!) 135/99 (BP Location: Right Arm)    Pulse 87    Temp 98.4 F (36.9 C) (Oral)    Resp 16    Ht 5\' 2"  (1.575 m)    Wt 95.7 kg    LMP 09/05/2021    SpO2 100%    BMI 38.59 kg/m  Physical Exam Vitals and nursing note reviewed. Exam conducted with a chaperone present.  Constitutional:      General: She is not in acute distress.    Appearance: She is not ill-appearing.  HENT:     Head: Normocephalic and atraumatic.     Nose: No congestion.  Eyes:     Conjunctiva/sclera: Conjunctivae normal.  Cardiovascular:     Rate and Rhythm: Normal rate and regular rhythm.     Pulses: Normal pulses.     Heart sounds: No murmur heard.   No friction rub. No gallop.  Pulmonary:  Effort: No respiratory distress.     Breath sounds: No wheezing, rhonchi or rales.  Abdominal:     Palpations: Abdomen is soft.     Tenderness: There is no abdominal tenderness. There is no right CVA tenderness or left CVA tenderness.     Comments: Abdomen nondistended normal bowel sounds, dull to percussion, nontender to palpation, no palpable fundus on my exam, there is no guarding, rebound times, peritoneal sign, negative CVA tenderness.  Genitourinary:    Comments: With chaperone presents right upper groin was visualized she has a small papule noted on her right upper groin there is no overlying skin changes no erythema  no drainage or discharge noted, area was about the size of a dime, no induration slight fluctuance noted. Musculoskeletal:     Right lower leg: No edema.     Left lower leg: No edema.  Skin:    General: Skin is warm and dry.  Neurological:     Mental Status: She is alert.  Psychiatric:        Mood and Affect: Mood normal.    ED Results / Procedures / Treatments   Labs (all labs ordered are listed, but only abnormal results are displayed) Labs Reviewed  PREGNANCY, URINE - Abnormal; Notable for the following components:      Result Value   Preg Test, Ur POSITIVE (*)    All other components within normal limits  HCG, QUANTITATIVE, PREGNANCY - Abnormal; Notable for the following components:   hCG, Beta Chain, Quant, S 994 (*)    All other components within normal limits  URINALYSIS, ROUTINE W REFLEX MICROSCOPIC    EKG None  Radiology No results found.  Procedures Procedures    Medications Ordered in ED Medications - No data to display  ED Course/ Medical Decision Making/ A&P                           Medical Decision Making Amount and/or Complexity of Data Reviewed Labs: ordered. Radiology: ordered.  Risk Prescription drug management.   This patient presents to the ED for concern of pregnancy of unknown origin, this involves an extensive number of treatment options, and is a complaint that carries with it a high risk of complications and morbidity.  The differential diagnosis includes ectopic, appendicitis, UTI    Additional history obtained:  Additional history obtained from electronic medical record External records from outside source obtained and reviewed including previous ED notes please see HPI above   Co morbidities that complicate the patient evaluation  Placenta abruptio  Social Determinants of Health:  N/A    Lab Tests:  I Ordered, and personally interpreted labs.  The pertinent results include: UA unremarkable, urine pregnancy is  positive, quantitative hCG is 994   Reevaluation:  Reassessed the patient she notified me that she has hidradenitis and she want me to take a look at a papule on her right upper groin.  Nurse was present we evaluated, very small minimal fluctuance noted,  along her bikini line discussed risks and benefits of I&D which includes abnormal scarring the area she states that she would rather start with antibiotics first and come back if it needs to be lanced.   Patient is reassessed, she states she is having no abdominal pain, she has had none today, I reassessed her abdomen it was nontender on my exam, I explained to her that her hCG is trending appropriately, but at this point it is unclear  where the pregnancy is, the concern is an ectopic pregnancy and we discussed transvaginal ultrasound to help locate it.  Patient would like to defer since she is not having pain she would like just to have follow-up, I find this acceptable as she is having no pain my exam her hCG is uptrending as appropriately, also unlikely we would see an IUP at this time since her hCG is  995.     Test Considered:  Transvaginal ultrasound but which shared decision making this was deferred I have very low suspicion at this time for an ectopic pregnancy she is having no pain, no tenderness on my exam, hCG is uptrending appropriately.    Rule out  Low Suspicion for ovarian torsion presentation atypical etiology.  Low suspicion for UTI, Pilo, kidney stone as UA is negative for signs infection, no hematuria, no flank tenderness.  I have low suspicion for appendicitis she has no abdominal tenderness she is nontoxic-appearing atypical etiology.  I have low suspicion for STI she denies any vaginal discharge vaginal bleeding denies any vaginal discomfort.    Dispostion and problem list  After consideration of the diagnostic results and the patients response to treatment, I feel that the patent would benefit from   Pregnancy-likely  patient is [redacted] weeks pregnant based on last menstrual cycle,will have her follow-up with OBGYN  for repeat quant given strict return precautions if pain occurs consistent she is come back immediately for repeat ultrasound. Abscess-patient has a very small abscess noted in her right groin, she deferred on I&D, will start on a short course of antibiotics, she is pregnant will not opt out on doxycycline, will use Keflex.  Advised to come back in 4 to 5 days time if symptoms do not improve.            Final Clinical Impression(s) / ED Diagnoses Final diagnoses:  Positive pregnancy test  Abscess    Rx / DC Orders ED Discharge Orders          Ordered    cephALEXin (KEFLEX) 500 MG capsule  2 times daily        10/04/21 1212              Marcello Fennel, PA-C 10/04/21 1203    Marcello Fennel, PA-C 10/04/21 [place], DO 10/04/21 1225

## 2021-10-04 NOTE — Discharge Instructions (Signed)
Lab work was reassuring, I would  like you to follow-up with your OB/GYN, please schedule a follow-up appointment.  If you start to have pain in the lower abdomen and/or pelvis region you need to come back for reevaluation as I explained before we are concerned for the pregnancy be in the wrong place.

## 2021-11-17 ENCOUNTER — Other Ambulatory Visit: Payer: Self-pay

## 2021-11-17 ENCOUNTER — Encounter (HOSPITAL_BASED_OUTPATIENT_CLINIC_OR_DEPARTMENT_OTHER): Payer: Self-pay | Admitting: Emergency Medicine

## 2021-11-17 ENCOUNTER — Emergency Department (HOSPITAL_BASED_OUTPATIENT_CLINIC_OR_DEPARTMENT_OTHER)
Admission: EM | Admit: 2021-11-17 | Discharge: 2021-11-17 | Disposition: A | Discharge status: Home / Self Care | Payer: BC Managed Care – PPO | Attending: Emergency Medicine | Admitting: Emergency Medicine

## 2021-11-17 DIAGNOSIS — R1030 Lower abdominal pain, unspecified: Secondary | ICD-10-CM

## 2021-11-17 DIAGNOSIS — N949 Unspecified condition associated with female genital organs and menstrual cycle: Secondary | ICD-10-CM

## 2021-11-17 DIAGNOSIS — K5900 Constipation, unspecified: Secondary | ICD-10-CM | POA: Insufficient documentation

## 2021-11-17 DIAGNOSIS — I10 Essential (primary) hypertension: Secondary | ICD-10-CM | POA: Diagnosis not present

## 2021-11-17 LAB — URINALYSIS, ROUTINE W REFLEX MICROSCOPIC
Bilirubin Urine: NEGATIVE
Glucose, UA: NEGATIVE mg/dL
Hgb urine dipstick: NEGATIVE
Ketones, ur: NEGATIVE mg/dL
Leukocytes,Ua: NEGATIVE
Nitrite: NEGATIVE
Protein, ur: NEGATIVE mg/dL
Specific Gravity, Urine: >=1.03 (ref 1.005–1.030)
pH: 7 (ref 5.0–8.0)

## 2021-11-17 LAB — PREGNANCY, URINE: Preg Test, Ur: POSITIVE — AB

## 2021-11-17 NOTE — ED Notes (Signed)
Pt ambulatory with steady gait to bathroom to provide urine specimen.

## 2021-11-17 NOTE — ED Triage Notes (Addendum)
Pt arrives pov with c/o abdominal pain that started last night after sneezing. Pt  denies bleeding or n/v. Pt endorse [redacted] weeks pregnant. Also reports constipation.  ?

## 2021-11-17 NOTE — Discharge Instructions (Addendum)
For your pregnancy related constipation, recommend increasing water intake, activity, fiber.  If this is not working you may use an over the counter stool softener Colace (can discuss with pharmacist as well), or a bulk forming laxative psillium (like metamucil).   ?

## 2021-11-17 NOTE — ED Provider Notes (Signed)
?[facility] EMERGENCY DEPARTMENT ?Provider Note ? ? ?CSN: [id number] ?Arrival date & time: 11/17/21  [id number] ? ?  ? ?History ? ?Chief Complaint  ?Patient presents with  ? Abdominal Pain  ? ? ?Angela Cantrell is a 31 y.o. female. ? ?HPI ? ?  ? ?31yo female [phone] with history of hypertension, PE after CS, 10wk 3 days based on [place] 2/22 [facility] who presents with concern for abdominal pain. ? ?Reports she has had some mild pressure that she thought was related to constipation over the last few days.  Today, she sneezed, and suddenly felt lower pelvic pain that was sharp for a second then improved.   ? ?Has had constipation, did have small BM yesterday, still feeling constipated. No nausea, vomiting, fever, dysuria. Appetite been ok. Has not taken anything in last few days for constipation.   No cough, dyspnea, chest pain.  ? ?No vaginal bleeding or vaginal discharge.  No cramping.  ? ?Prior neonatal demise 28wk in 2017, fetal demise in 2016 at 27wk.  ? ?Past Medical History:  ?Diagnosis Date  ? Hypertension   ? Pulmonary embolism ([facility])   ?  ? ?Home Medications ?Prior to Admission medications   ?Medication Sig Start Date End Date Taking? Authorizing Provider  ?prenatal vitamin w/FE, FA (NATACHEW) 29-1 MG CHEW chewable tablet Chew 1 tablet by mouth daily. 10/01/21 12/30/21  Geryl Councilman L, PA  ?simethicone (GAS-X) 80 MG chewable tablet Chew 1 tablet (80 mg total) by mouth every 6 (six) hours as needed for up to 10 doses (gas pain). 10/01/21   Crain, Whitney L, PA  ?VITAMIN D PO Take by mouth once a week.    [provider]  ?   ? ?Allergies    ?Patient has no known allergies.   ? ?Review of Systems   ?Review of Systems ? ?Physical Exam ?Updated Vital Signs ?BP 119/74 (BP Location: Right Arm)   Pulse 64   Temp 98.3 ?F (36.8 ?C) (Oral)   Resp 18   Ht 5\' 2"  (1.575 m)   Wt 94.3 kg   LMP 09/04/2021   SpO2 100%   BMI 38.04 kg/m?  ?Physical Exam ?Vitals and nursing note reviewed.   ?Constitutional:   ?   General: She is not in acute distress. ?   Appearance: Normal appearance. She is not ill-appearing, toxic-appearing or diaphoretic.  ?HENT:  ?   Head: Normocephalic.  ?Eyes:  ?   Conjunctiva/sclera: Conjunctivae normal.  ?Cardiovascular:  ?   Rate and Rhythm: Normal rate and regular rhythm.  ?   Pulses: Normal pulses.  ?Pulmonary:  ?   Effort: Pulmonary effort is normal. No respiratory distress.  ?Abdominal:  ?   General: Abdomen is flat. Bowel sounds are normal. There is no distension.  ?   Palpations: Abdomen is soft. There is no shifting dullness.  ?   Tenderness: There is no abdominal tenderness. There is no right CVA tenderness or left CVA tenderness.  ?   Hernia: No hernia is present.  ?Musculoskeletal:     ?   General: No deformity or signs of injury.  ?   Cervical back: No rigidity.  ?Skin: ?   General: Skin is warm and dry.  ?   Coloration: Skin is not jaundiced or pale.  ?Neurological:  ?   General: No focal deficit present.  ?   Mental Status: She is alert and oriented to person, place, and time.  ? ? ?ED Results / Procedures /  Treatments   ?Labs ?(all labs ordered are listed, but only abnormal results are displayed) ?Labs Reviewed  ?PREGNANCY, URINE - Abnormal; Notable for the following components:  ?    Result Value  ? Preg Test, Ur POSITIVE (*)   ? All other components within normal limits  ?URINALYSIS, ROUTINE W REFLEX MICROSCOPIC  ? ? ?EKG ?None ? ?Radiology ?No results found. ? ?Procedures ?Procedures  ? ? ?Medications Ordered in ED ?Medications - No data to display ? ?ED Course/ Medical Decision Making/ A&P ?  ?                        ?Medical Decision Making ?Amount and/or Complexity of Data Reviewed ?Labs: ordered. ? ? ? ?31yo female [phone] with history of hypertension, PE after CS, 10wk 3 days based on [place] 2/22 [facility] who presents with concern for abdominal pain. ? ?Exam and history not consistent with appendicitis, diverticulitis, cholecystitis, pancreatitis,  nephrolithiasis, incarcerated hernia, SBO. ? ?UA without infection. ? ?Given description of brief sharp pain after sneezing, suspect round ligament pain.  Recommend continued supportive care fo constipation, monitoring of symptoms, OBGYN follow up, return for worsening. Patient discharged in stable condition with understanding of reasons to return.  ? ? ? ? ? ? ? ?Final Clinical Impression(s) / ED Diagnoses ?Final diagnoses:  ?Round ligament pain  ?Lower abdominal pain  ?Constipation, unspecified constipation type  ? ? ?Rx / DC Orders ?ED Discharge Orders   ? ? None  ? ?  ? ? ?  ?Gareth Morgan, MD ?11/17/21 2146 ? ?

## 2022-02-28 ENCOUNTER — Emergency Department (HOSPITAL_BASED_OUTPATIENT_CLINIC_OR_DEPARTMENT_OTHER)
Admission: EM | Admit: 2022-02-28 | Discharge: 2022-03-01 | Disposition: A | Discharge status: Home / Self Care | Payer: Medicaid Other | Attending: Emergency Medicine | Admitting: Emergency Medicine

## 2022-02-28 ENCOUNTER — Other Ambulatory Visit: Payer: Self-pay

## 2022-02-28 ENCOUNTER — Encounter (HOSPITAL_BASED_OUTPATIENT_CLINIC_OR_DEPARTMENT_OTHER): Payer: Self-pay | Admitting: Urology

## 2022-02-28 DIAGNOSIS — Z3A20 20 weeks gestation of pregnancy: Secondary | ICD-10-CM | POA: Diagnosis not present

## 2022-02-28 DIAGNOSIS — O26892 Other specified pregnancy related conditions, second trimester: Secondary | ICD-10-CM | POA: Diagnosis present

## 2022-02-28 DIAGNOSIS — O99712 Diseases of the skin and subcutaneous tissue complicating pregnancy, second trimester: Secondary | ICD-10-CM | POA: Insufficient documentation

## 2022-02-28 DIAGNOSIS — L02412 Cutaneous abscess of left axilla: Secondary | ICD-10-CM

## 2022-02-28 NOTE — ED Triage Notes (Signed)
Boil under left axilla x 3 weeks, Denies any drainage  State hard knot   Pt [redacted] week gestation with prenatal care

## 2022-03-01 MED ORDER — CEPHALEXIN 500 MG PO CAPS: 500.0000 mg | ORAL_CAPSULE | Freq: Four times a day (QID) | ORAL | 0 refills | Status: DC

## 2022-03-01 MED ORDER — LIDOCAINE HCL (PF) 1 % IJ SOLN: 30.0000 mL | Freq: Once | INTRAMUSCULAR | Status: DC

## 2022-03-01 MED ORDER — LIDOCAINE HCL 1 % IJ SOLN
INTRAMUSCULAR | Status: AC
  Filled 2022-03-01: qty 20

## 2022-03-01 NOTE — Discharge Instructions (Signed)
Apply warm compresses as frequently as possible for the next several days.  If the area becomes red and more painful, fill the prescription for Keflex you have been provided this evening.  Return to the ER if symptoms significantly worsen or change.

## 2022-03-01 NOTE — ED Provider Notes (Signed)
[facility] EMERGENCY DEPARTMENT Provider Note   CSN: [id number] Arrival date & time: 02/28/22  2354     History  Chief Complaint  Patient presents with   Abscess    Angela Cantrell is a 31 y.o. female.  Patient is a 31 year old female with past medical history of reflux.  Patient is currently pregnant at approximately 20+ weeks gestation.  She presents today for evaluation of pain and swelling under her left axilla.  She has history of abscesses in the past and this feels similar.  She denies any fevers or chills.  Pain is worse with palpation and movement of her arm and relieved somewhat with rest.  The history is provided by the patient.       Home Medications Prior to Admission medications   Medication Sig Start Date End Date Taking? Authorizing Provider  simethicone (GAS-X) 80 MG chewable tablet Chew 1 tablet (80 mg total) by mouth every 6 (six) hours as needed for up to 10 doses (gas pain). 10/01/21   Crain, Whitney L, PA  VITAMIN D PO Take by mouth once a week.    [provider]      Allergies    Patient has no known allergies.    Review of Systems   Review of Systems  All other systems reviewed and are negative.   Physical Exam Updated Vital Signs BP 137/89 (BP Location: Right Arm)   Pulse 83   Temp 98.5 F (36.9 C) (Oral)   Resp 18   Ht 5\' 2"  (1.575 m)   Wt 102.1 kg   LMP 09/04/2021   SpO2 100%   BMI 41.15 kg/m  Physical Exam Vitals and nursing note reviewed.  Constitutional:      General: She is not in acute distress.    Appearance: Normal appearance. She is not ill-appearing.  HENT:     Head: Normocephalic and atraumatic.  Pulmonary:     Effort: Pulmonary effort is normal.  Skin:    General: Skin is warm and dry.     Comments: There is a 3 cm x 3 cm, fluctuant, tender area under the left axilla.  Neurological:     Mental Status: She is alert.     ED Results / Procedures / Treatments   Labs (all labs ordered are  listed, but only abnormal results are displayed) Labs Reviewed - No data to display  EKG None  Radiology No results found.  Procedures Procedures    Medications Ordered in ED Medications  lidocaine (PF) (XYLOCAINE) 1 % injection 30 mL (has no administration in time range)  lidocaine (XYLOCAINE) 1 % (with pres) injection (has no administration in time range)    ED Course/ Medical Decision Making/ A&P  Abscess of the left axilla incised and drained as below.  Patient tolerated procedure well.  As patient is pregnant, I will forego antibiotics unless redness and swelling worsens.  Patient to perform warm compresses and return to the ER if symptoms worsen or change.  INCISION AND DRAINAGE Performed by: 09/06/2021 Consent: Verbal consent obtained. Risks and benefits: risks, benefits and alternatives were discussed Type: abscess  Body area: Left axilla  Anesthesia: local infiltration  Incision was made with a scalpel.  Local anesthetic: lidocaine 1% without epinephrine  Anesthetic total: 2 ml  Complexity: complex Blunt dissection to break up loculations  Drainage: purulent  Drainage amount: Moderate  Packing material: No packing placed  Patient tolerance: Patient tolerated the procedure well with no immediate complications.  Final Clinical Impression(s) / ED Diagnoses Final diagnoses:  None    Rx / DC Orders ED Discharge Orders     None         Geoffery Lyons, MD 03/01/22 [phone]

## 2022-09-30 ENCOUNTER — Encounter (HOSPITAL_COMMUNITY): Payer: Self-pay

## 2022-09-30 ENCOUNTER — Ambulatory Visit (HOSPITAL_COMMUNITY)
Admission: EM | Admit: 2022-09-30 | Discharge: 2022-09-30 | Disposition: A | Discharge status: Home / Self Care | Payer: Medicaid Other | Attending: Family Medicine | Admitting: Family Medicine

## 2022-09-30 DIAGNOSIS — M79641 Pain in right hand: Secondary | ICD-10-CM

## 2022-09-30 DIAGNOSIS — I10 Essential (primary) hypertension: Secondary | ICD-10-CM | POA: Diagnosis not present

## 2022-09-30 MED ORDER — KETOROLAC TROMETHAMINE 60 MG/2ML IM SOLN
60.0000 mg | Freq: Once | INTRAMUSCULAR | Status: AC
  Administered 2022-09-30: 60 mg via INTRAMUSCULAR

## 2022-09-30 MED ORDER — INDOMETHACIN 50 MG PO CAPS: 50.0000 mg | ORAL_CAPSULE | Freq: Three times a day (TID) | ORAL | 0 refills | Status: DC

## 2022-09-30 MED ORDER — PREDNISONE 20 MG PO TABS: 40.0000 mg | ORAL_TABLET | Freq: Every day | ORAL | 0 refills | Status: DC

## 2022-09-30 MED ORDER — KETOROLAC TROMETHAMINE 60 MG/2ML IM SOLN
INTRAMUSCULAR | Status: AC
  Filled 2022-09-30: qty 2

## 2022-09-30 NOTE — ED Provider Notes (Signed)
[facility]   [id number] 09/30/22 Arrival Time: 7619  ASSESSMENT & PLAN:  1. Right hand pain   2. Elevated blood pressure reading in office with diagnosis of hypertension    Unclear of exact etiology; maybe gout; discussed.  No indication for plain imaging at this time. Trial of: New Prescriptions   INDOMETHACIN (INDOCIN) 50 MG CAPSULE    Take 1 capsule (50 mg total) by mouth 3 (three) times daily with meals.   PREDNISONE (DELTASONE) 20 MG TABLET    Take 2 tablets (40 mg total) by mouth daily.   Orders Placed This Encounter  Procedures   Apply Thumb spica  To wear for up to 5 days.  Work/school excuse note: not needed. Recommend:  Follow-up Information     Schedule an appointment as soon as possible for a visit  with [facility].   Contact information: [place] [phone]                 Discharge Instructions       Meds ordered this encounter  Medications   predniSONE (DELTASONE) 20 MG tablet    Sig: Take 2 tablets (40 mg total) by mouth daily.    Dispense:  10 tablet    Refill:  0   indomethacin (INDOCIN) 50 MG capsule    Sig: Take 1 capsule (50 mg total) by mouth 3 (three) times daily with meals.    Dispense:  15 capsule    Refill:  0   ketorolac (TORADOL) injection 60 mg   Your blood pressure was noted to be elevated during your visit today. If you are currently taking medication for high blood pressure, please ensure you are taking this as directed. If you do not have a history of high blood pressure and your blood pressure remains persistently elevated, you may need to begin taking a medication at some point. You may return here within the next few days to recheck if unable to see your primary care provider or if you do not have a one.  BP (!) 162/133 (BP Location: Left Arm)   Pulse 64   Temp 98.5 F (36.9 C) (Oral)   Resp 16   LMP  (LMP Unknown)   SpO2 98%    BP Readings from Last 3 Encounters:  09/30/22 (!) 162/133  02/28/22 137/89  11/17/21 119/74       Reviewed expectations re: course of current medical issues. Questions answered. Outlined signs and symptoms indicating need for more acute intervention. Patient verbalized understanding. After Visit Summary given.  SUBJECTIVE: History from: patient. Angela Cantrell is a 32 y.o. female who reports R hand pain; mild swelling; x 1 day; no trauma or known injury. Afebrile. No h/o similar. No tx PTA. No extremity sensation changes or weakness.   Past Surgical History:  Procedure Laterality Date   CESAREAN SECTION      Increased blood pressure noted today. Reports that she is treated for HTN. She reports taking medications as instructed, no chest pain on exertion, no dyspnea on exertion, no swelling of ankles, no orthostatic dizziness or lightheadedness, no orthopnea or paroxysmal nocturnal dyspnea, and no palpitations.  OBJECTIVE:  Vitals:   09/30/22 0952  BP: (!) 162/133  Pulse: 64  Resp: 16  Temp: 98.5 F (36.9 C)  TempSrc: Oral  SpO2: 98%    General appearance: alert; no distress HEENT: [place]; AT Neck: supple with FROM Resp: unlabored respirations Extremities:  RUE: warm with well perfused appearance; pain reported over radial side of wrist, more notable with thumb and wrist movement; no erythema or inflammation; no swelling; FROM; very tender over radial styloid CV: brisk extremity capillary refill of RUE; 2+ radial pulse of RUE. Skin: warm and dry; no visible rashes Neurologic: gait normal; normal sensation and strength of RUE Psychological: alert and cooperative; normal mood and affect   No Known Allergies  Past Medical History:  Diagnosis Date   Hypertension    Pulmonary embolism ([facility])    Social History   Socioeconomic History   Marital status: Single    Spouse name: Not on file   Number of children: Not on file   Years of education: Not on file    Highest education level: Not on file  Occupational History   Not on file  Tobacco Use   Smoking status: Every Day    Packs/day: 0.25    Types: Cigarettes   Smokeless tobacco: Never  Vaping Use   Vaping Use: Former  Substance and Sexual Activity   Alcohol use: Not Currently    Comment: less than once a week   Drug use: Not Currently    Types: Marijuana   Sexual activity: Yes    Partners: Male    Birth control/protection: None  Other Topics Concern   Not on file  Social History Narrative   Not on file   Social Determinants of Health   Financial Resource Strain: Not on file  Food Insecurity: Food Insecurity Present (04/23/2021)   Hunger Vital Sign    Worried About Running Out of Food in the Last Year: Never true    Ran Out of Food in the Last Year: Sometimes true  Transportation Needs: No Transportation Needs (04/23/2021)   PRAPARE - Hydrologist (Medical): No    Lack of Transportation (Non-Medical): No  Physical Activity: Not on file  Stress: Not on file  Social Connections: Not on file   Family History  Problem Relation Age of Onset   Hypertension Mother    Hypertension Father    Lymphoma Paternal Grandmother    Diabetes Paternal Grandmother    Past Surgical History:  Procedure Laterality Date   CESAREAN SECTION         Vanessa Kick, MD 10/02/22 1118

## 2022-09-30 NOTE — Discharge Instructions (Addendum)
Meds ordered this encounter  Medications   predniSONE (DELTASONE) 20 MG tablet    Sig: Take 2 tablets (40 mg total) by mouth daily.    Dispense:  10 tablet    Refill:  0   indomethacin (INDOCIN) 50 MG capsule    Sig: Take 1 capsule (50 mg total) by mouth 3 (three) times daily with meals.    Dispense:  15 capsule    Refill:  0   ketorolac (TORADOL) injection 60 mg   Your blood pressure was noted to be elevated during your visit today. If you are currently taking medication for high blood pressure, please ensure you are taking this as directed. If you do not have a history of high blood pressure and your blood pressure remains persistently elevated, you may need to begin taking a medication at some point. You may return here within the next few days to recheck if unable to see your primary care provider or if you do not have a one.  BP (!) 162/133 (BP Location: Left Arm)   Pulse 64   Temp 98.5 F (36.9 C) (Oral)   Resp 16   LMP  (LMP Unknown)   SpO2 98%   BP Readings from Last 3 Encounters:  09/30/22 (!) 162/133  02/28/22 137/89  11/17/21 119/74

## 2022-09-30 NOTE — ED Triage Notes (Signed)
Pt is here for right hand pain , and swelling x 1day

## 2022-10-23 ENCOUNTER — Other Ambulatory Visit: Payer: Self-pay

## 2022-10-23 ENCOUNTER — Encounter (HOSPITAL_BASED_OUTPATIENT_CLINIC_OR_DEPARTMENT_OTHER): Payer: Self-pay | Admitting: Emergency Medicine

## 2022-10-23 DIAGNOSIS — L02214 Cutaneous abscess of groin: Secondary | ICD-10-CM | POA: Insufficient documentation

## 2022-10-23 NOTE — ED Triage Notes (Signed)
Pt states she has a boil on her pantyline between her crotch and her leg on the left side  Sxs started on Monday

## 2022-10-24 ENCOUNTER — Emergency Department (HOSPITAL_BASED_OUTPATIENT_CLINIC_OR_DEPARTMENT_OTHER)
Admission: EM | Admit: 2022-10-24 | Discharge: 2022-10-24 | Disposition: A | Discharge status: Home / Self Care | Payer: Medicaid Other | Attending: Emergency Medicine | Admitting: Emergency Medicine

## 2022-10-24 DIAGNOSIS — L0291 Cutaneous abscess, unspecified: Secondary | ICD-10-CM

## 2022-10-24 MED ORDER — DOXYCYCLINE HYCLATE 100 MG PO CAPS: 100.0000 mg | ORAL_CAPSULE | Freq: Two times a day (BID) | ORAL | 0 refills | Status: DC

## 2022-10-24 MED ORDER — DOXYCYCLINE HYCLATE 100 MG PO TABS
100.0000 mg | ORAL_TABLET | Freq: Once | ORAL | Status: AC
  Administered 2022-10-24: 100 mg via ORAL
  Filled 2022-10-24: qty 1

## 2022-10-24 NOTE — ED Notes (Signed)
Pt states area has drained since she arrived here.  Area is not raised, tender to touch.

## 2022-10-24 NOTE — Discharge Instructions (Signed)
Begin taking doxycycline as prescribed.  Perform warm compresses or sitz bath's as frequently as possible for the next several days.  Return to the ER if symptoms worsen or change.

## 2022-10-24 NOTE — ED Provider Notes (Signed)
  [place] [facility] [facility] Provider Note   CSN: [id number] Arrival date & time: 10/23/22  2308     History  Chief Complaint  Patient presents with   Abscess    Angela Cantrell is a 31 y.o. female.  Patient is a 32 year old female with no significant past medical history.  Patient presenting today with complaints of swelling and pain to the left inguinal region.  She has a history of abscesses and this feels the same.  The abscess actually ruptured while she was waiting to be seen.  She denies any fevers or chills.  She denies other complaints.  The history is provided by the patient.       Home Medications Prior to Admission medications   Medication Sig Start Date End Date Taking? Authorizing Provider  indomethacin (INDOCIN) 50 MG capsule Take 1 capsule (50 mg total) by mouth 3 (three) times daily with meals. 09/30/22   Vanessa Kick, MD  NIFEdipine (PROCARDIA XL/NIFEDICAL XL) 60 MG 24 hr tablet Take 1 tablet by mouth daily. 06/06/22   [provider]  predniSONE (DELTASONE) 20 MG tablet Take 2 tablets (40 mg total) by mouth daily. 09/30/22   Vanessa Kick, MD      Allergies    Patient has no known allergies.    Review of Systems   Review of Systems  All other systems reviewed and are negative.   Physical Exam Updated Vital Signs BP (!) 144/115 (BP Location: Right Arm)   Pulse 97   Temp 97.7 F (36.5 C)   Resp 18   Ht 5\' 3"  (1.6 m)   Wt 108.9 kg   LMP  (LMP Unknown)   SpO2 99%   BMI 42.51 kg/m  Physical Exam Vitals and nursing note reviewed.  Constitutional:      General: She is not in acute distress.    Appearance: Normal appearance. She is not ill-appearing.  HENT:     Head: Normocephalic and atraumatic.  Pulmonary:     Effort: Pulmonary effort is normal.  Skin:    General: Skin is warm and dry.     Comments: In the inguinal region on the left, there is an area of drainage and mild erythema.  There is no  palpable fluctuance.  Neurological:     Mental Status: She is alert.     ED Results / Procedures / Treatments   Labs (all labs ordered are listed, but only abnormal results are displayed) Labs Reviewed - No data to display  EKG None  Radiology No results found.  Procedures Procedures    Medications Ordered in ED Medications  doxycycline (VIBRA-TABS) tablet 100 mg (has no administration in time range)    ED Course/ Medical Decision Making/ A&P  Patient presents with an abscess to the left inguinal region.  This spontaneously ruptured prior to being seen.  I feel no fluctuance and do not feel as though it needs to be further incised.  I will treat with sitz bath's, warm compresses, and doxycycline.  To follow-up as needed.  Final Clinical Impression(s) / ED Diagnoses Final diagnoses:  None    Rx / DC Orders ED Discharge Orders     None         Veryl Speak, MD 10/24/22 [phone]

## 2023-07-10 IMAGING — US US OB < 14 WEEKS - US OB TV
2 series · 13 of 28 positions shown · non-contrast
Comparison: None.

CLINICAL DATA: Right lower quadrant pain



[Series 1: us ob < 14 weeks - us ob tv · 7 of 51 slices shown (1 of 2)]
[im 4/51]
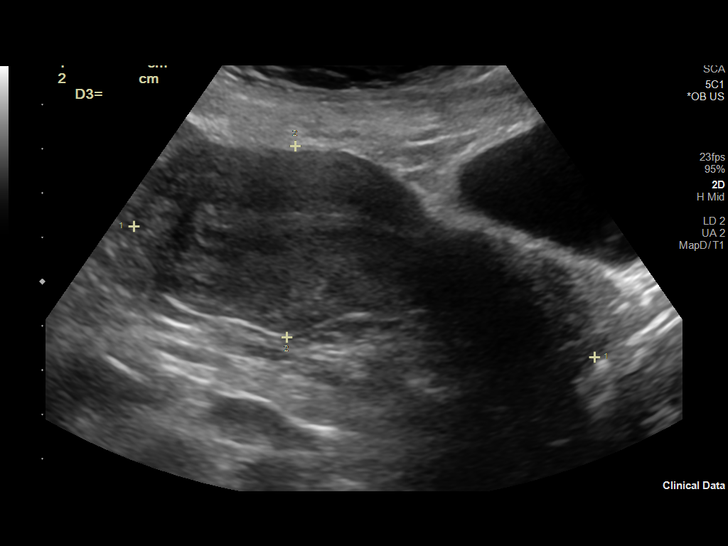
[im 11/51]
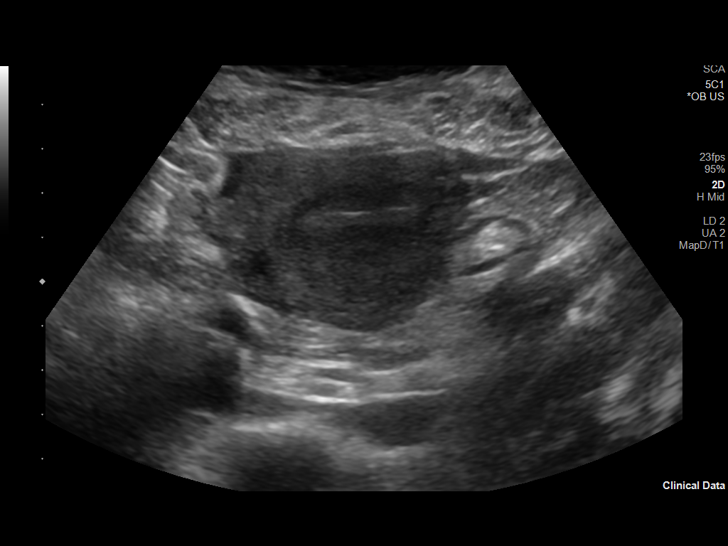
[im 18/51]
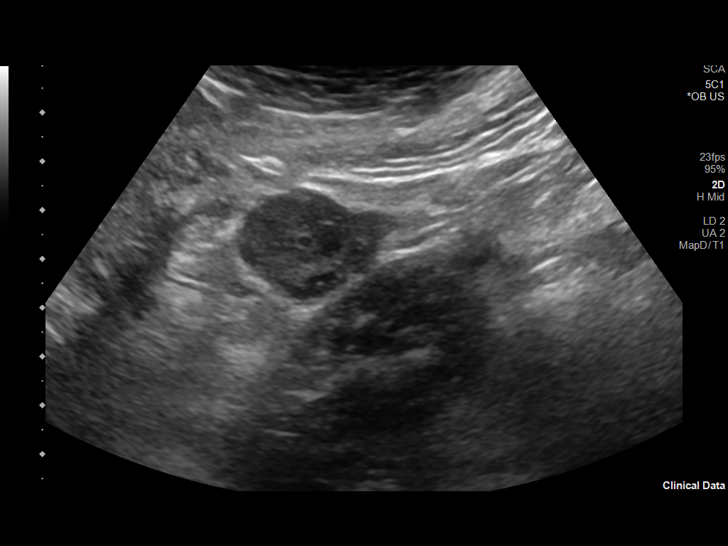
[im 26/51]
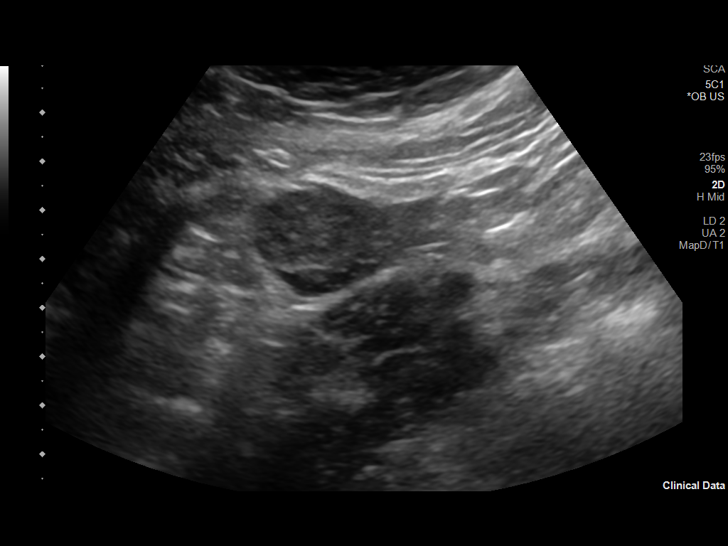
[im 33/51]
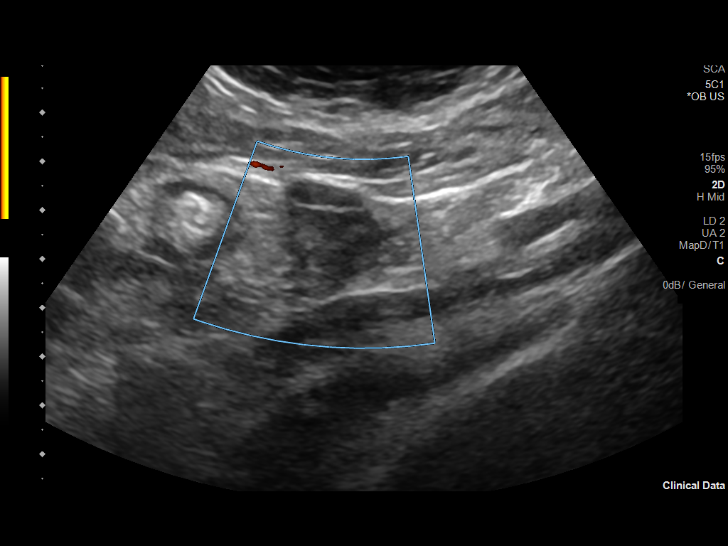
[im 40/51]
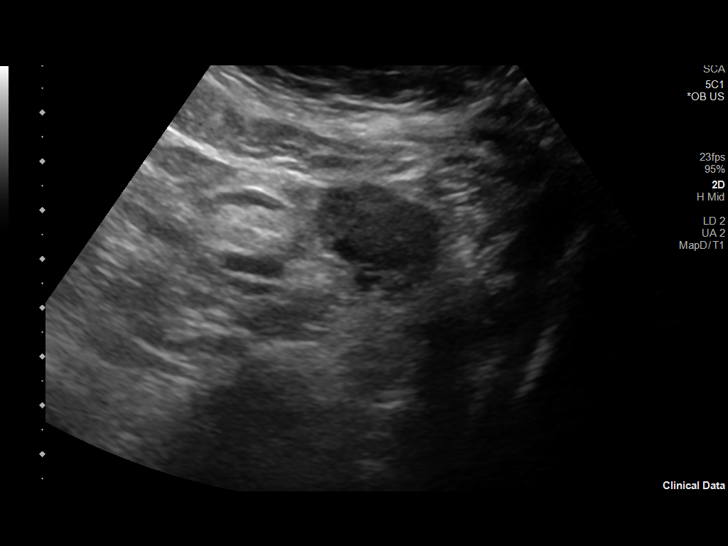
[im 51/51]
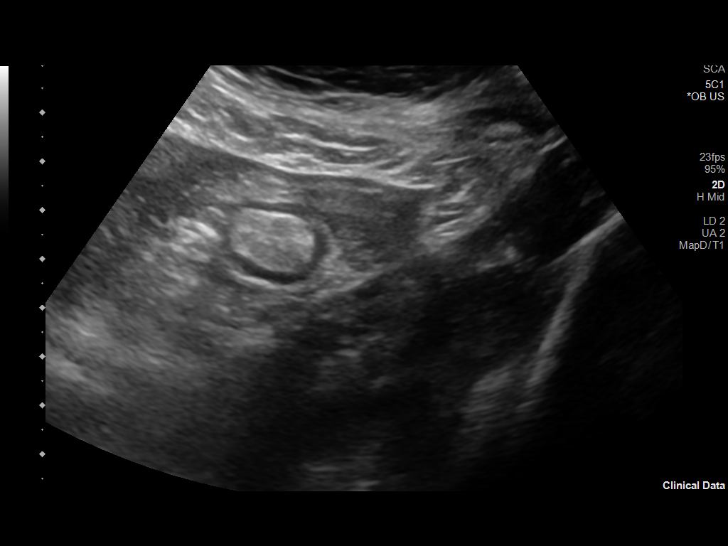

[Series 2: us ob < 14 weeks - us ob tv · 6 of 45 slices shown (2 of 2)]
[im 4/45]
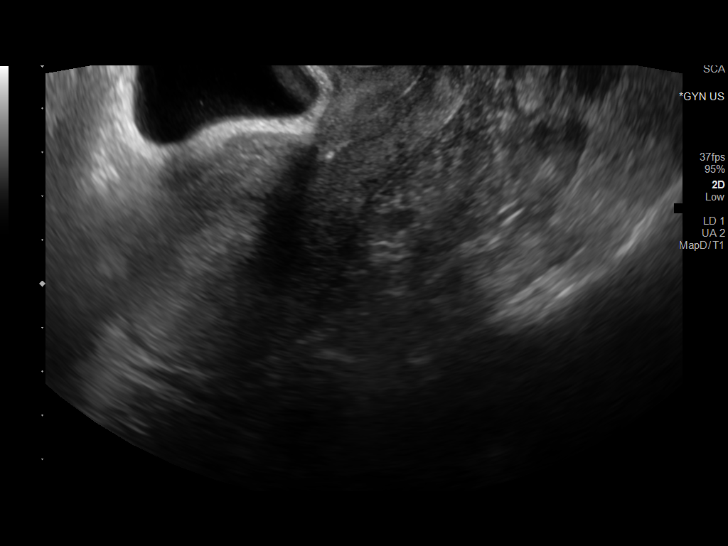
[im 12/45]
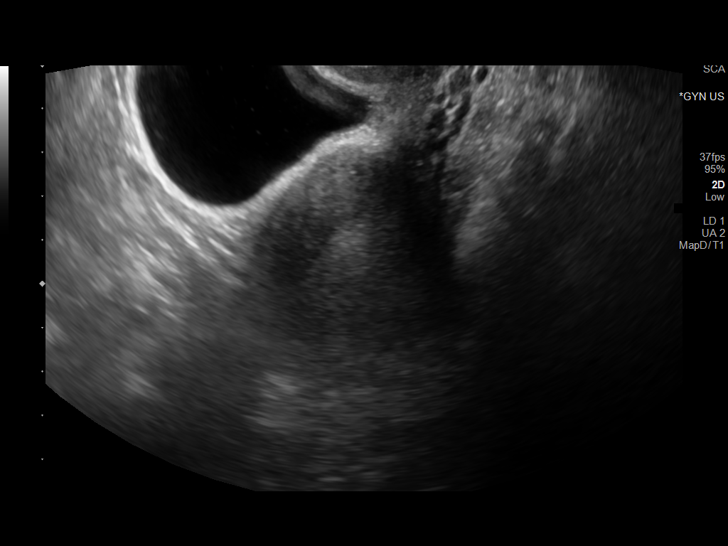
[im 19/45]
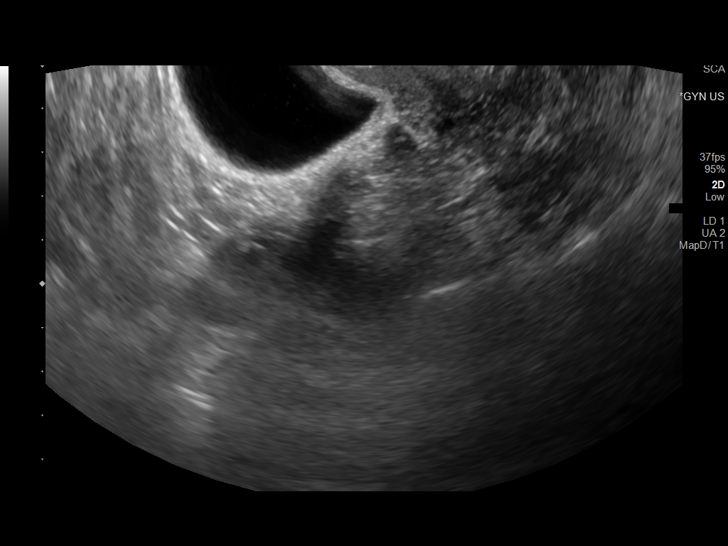
[im 26/45]
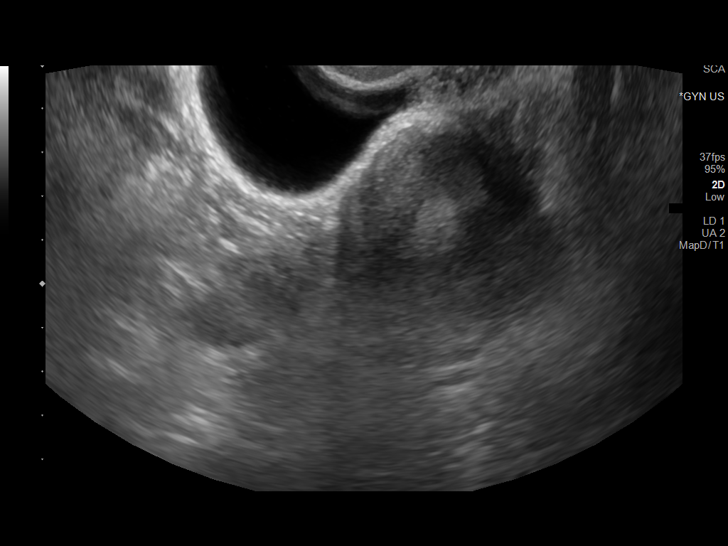
[im 34/45]
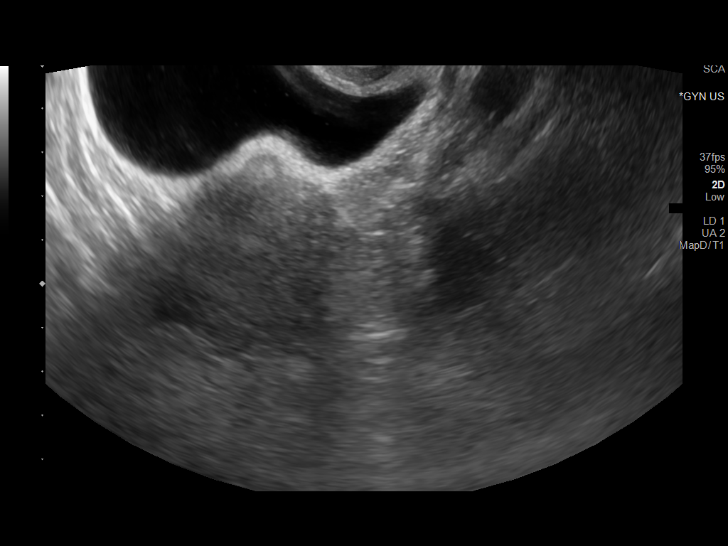
[im 41/45]
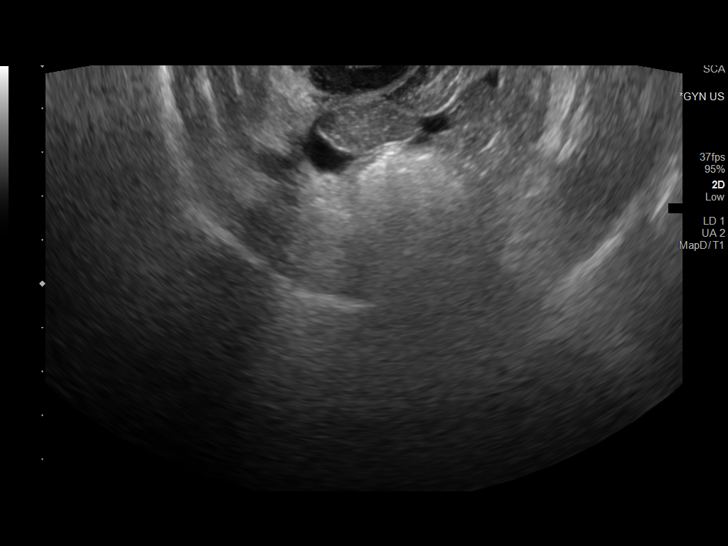

[13 of 28 positions shown; findings below may reference images not displayed]

FINDINGS: Intrauterine gestational sac: None

Yolk sac:  Not Visualized.

Embryo:  Not Visualized.

Cardiac Activity: Not Visualized.

Maternal uterus/adnexae:

Right ovary: Normal

Left ovary: Normal

Other :None

Free fluid:  Small amount of free fluid noted within the pelvis.

Pulsed Doppler evaluation of both ovaries demonstrates normal
appearing low-resistance arterial and venous waveforms.
IMPRESSION: 1. No intrauterine gestational sac, yolk sac, or fetal pole
identified. Differential considerations include intrauterine
pregnancy too early to be sonographically visualized, missed
abortion, or ectopic pregnancy. Followup ultrasound is recommended
in 10-14 days for further evaluation.
2. No signs of ovarian torsion.
3. Small volume of free fluid noted within the pelvis.

## 2023-07-10 IMAGING — US US ABDOMEN LIMITED
1 series · 14 of 18 positions shown · non-contrast
Comparison: None.

CLINICAL DATA: Right lower quadrant pain.  Pregnancy.

EXAM:
ULTRASOUND ABDOMEN LIMITED
TECHNIQUE: Gray scale imaging of the right lower quadrant was performed to
evaluate for suspected appendicitis. Standard imaging planes and
graded compression technique were utilized.

[Series 1: us abdomen limited · 18 acquisitions, 14 frames shown]
[im 1/18]
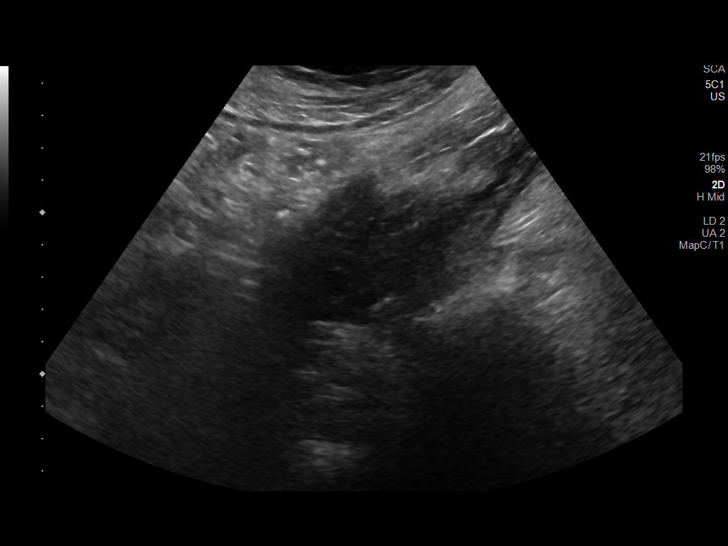
[im 2/18]
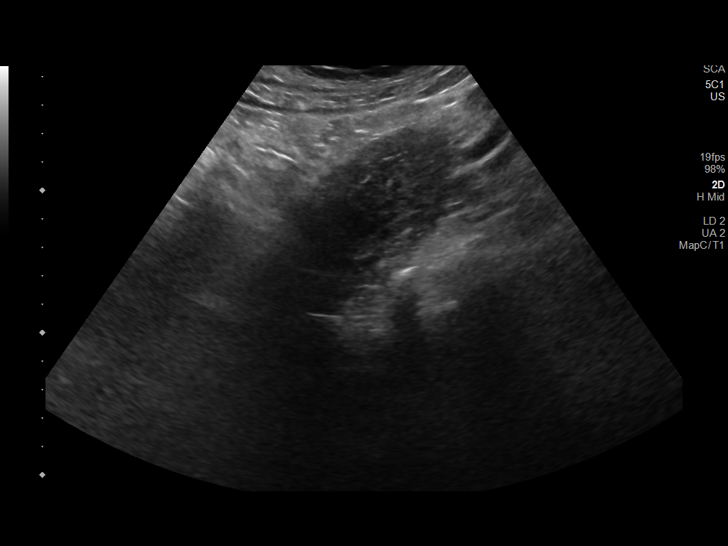
[im 4/18]
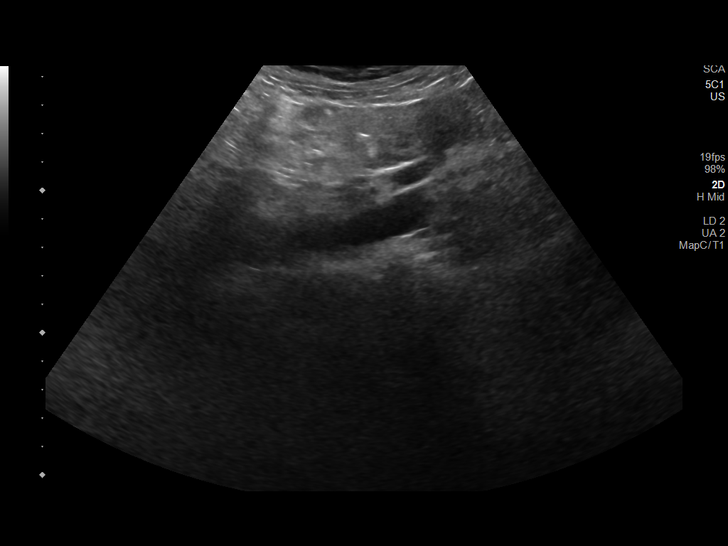
[im 5/18]
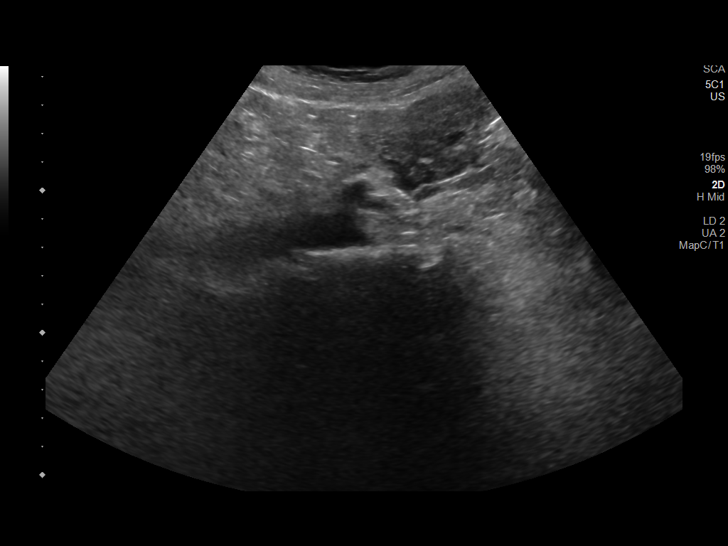
[im 6/18]
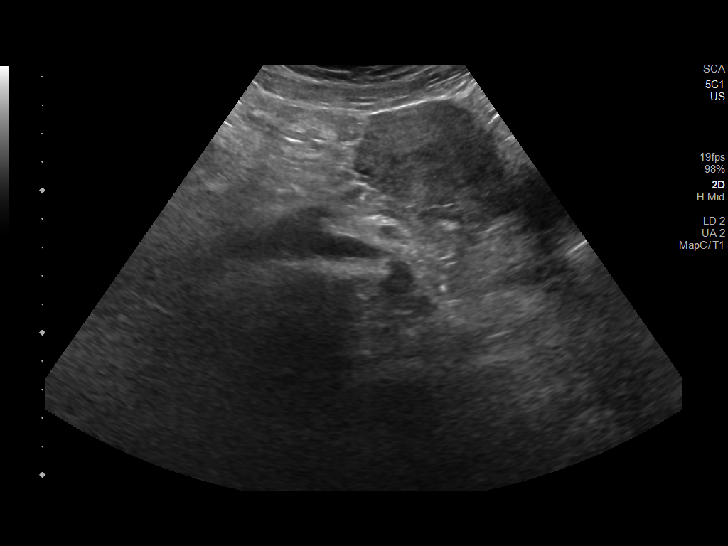
[im 8/18]
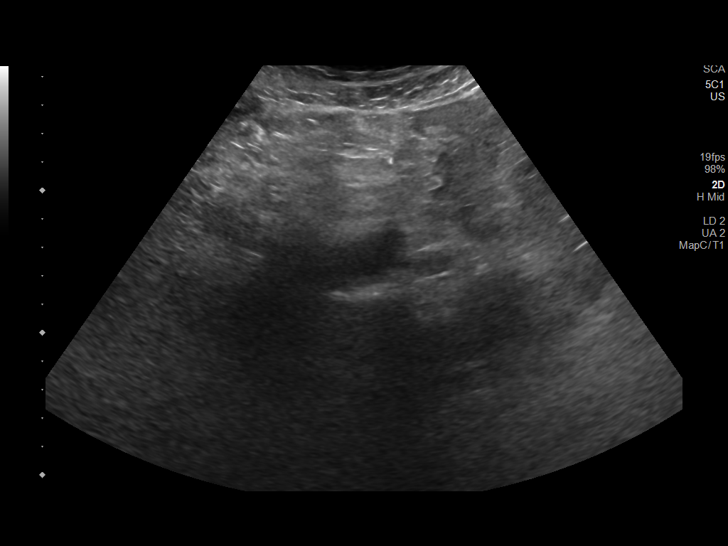
[im 9/18]
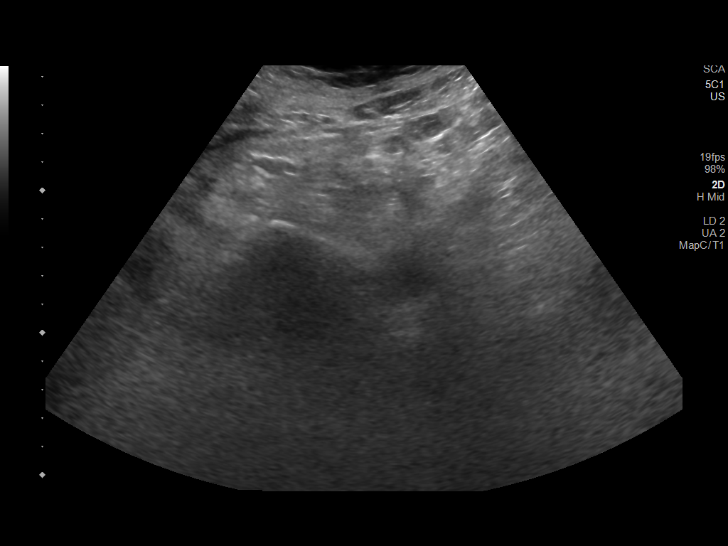
[im 10/18]
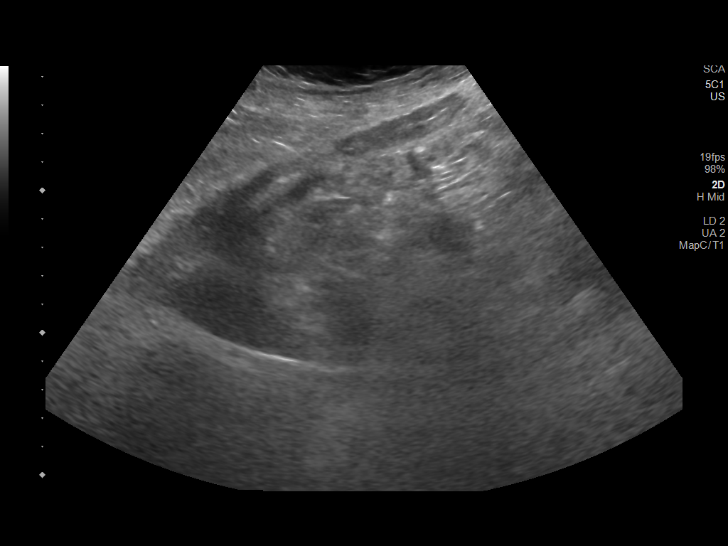
[im 11/18]
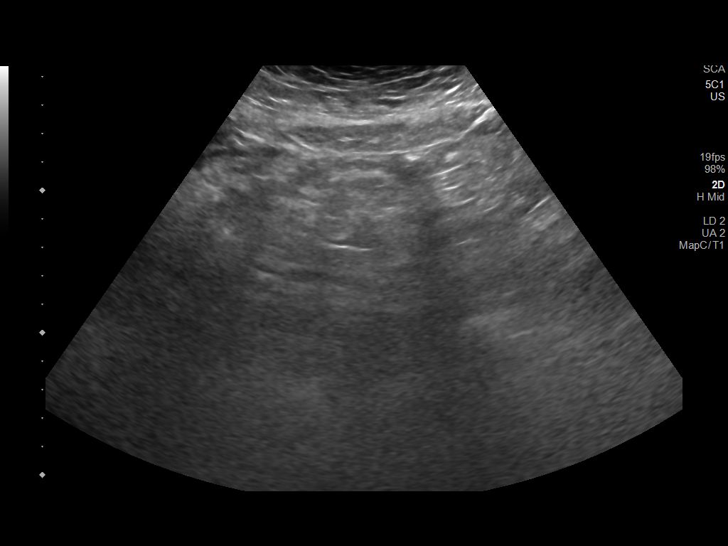
[im 13/18]
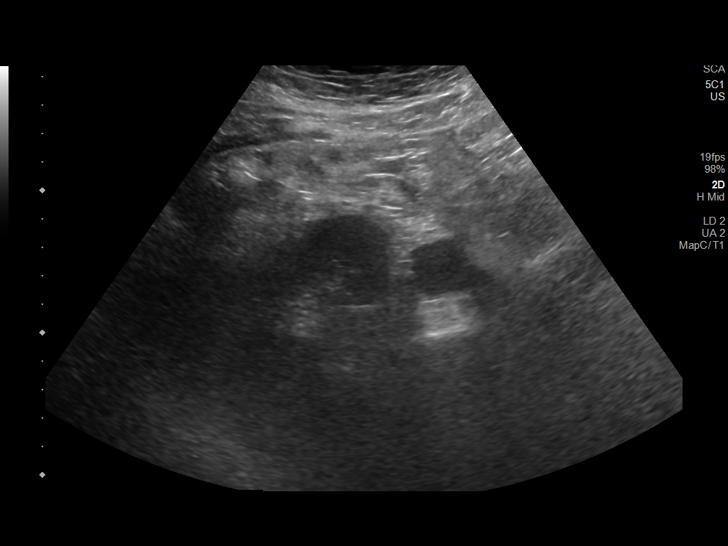
[im 14/18]
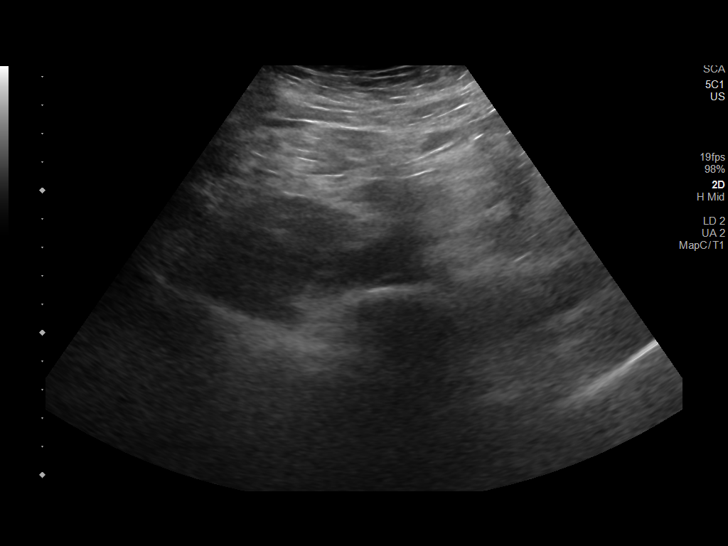
[im 15/18]
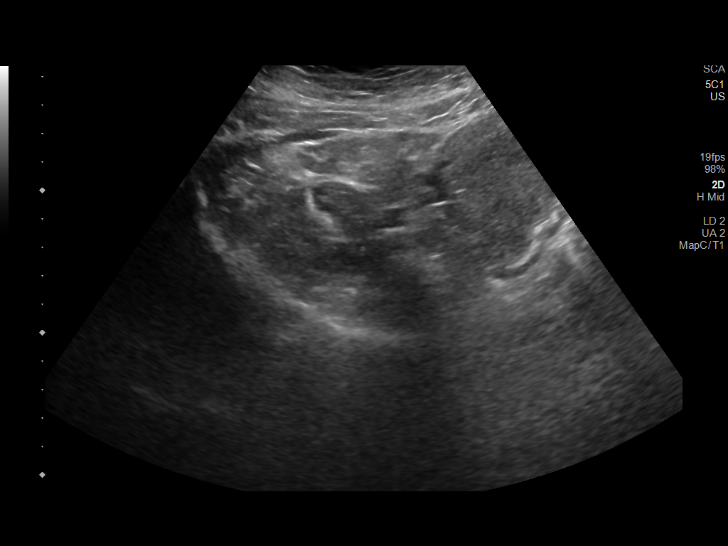
[im 17/18]
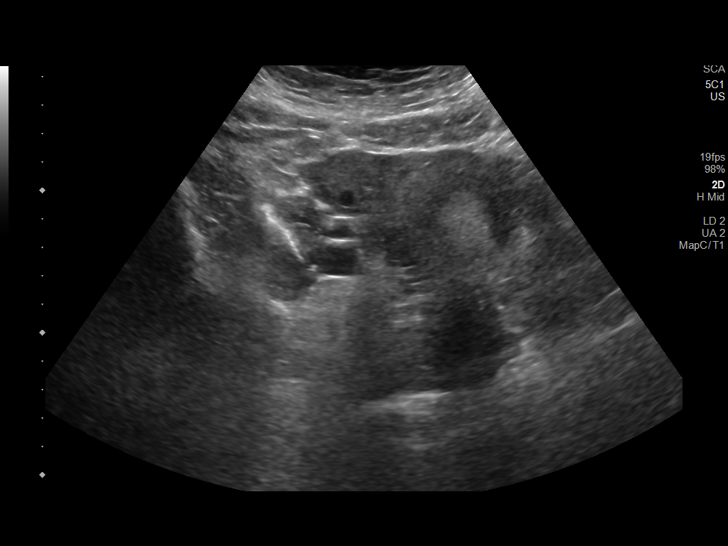
[im 18/18]
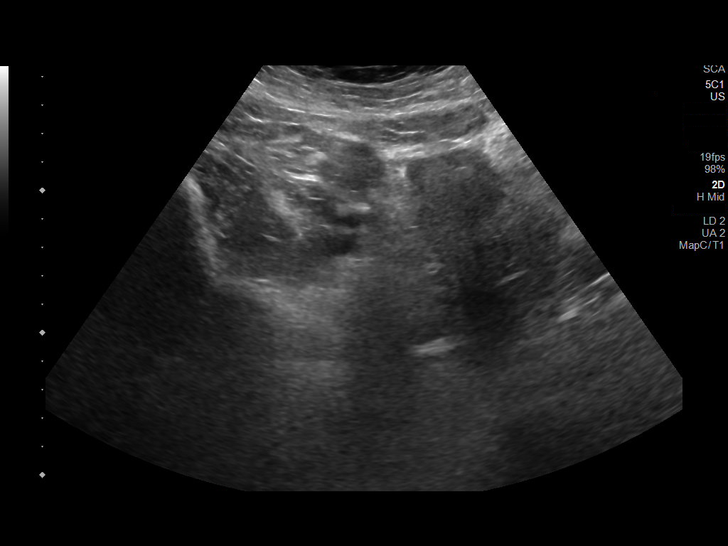

[14 of 18 positions shown; findings below may reference images not displayed]

FINDINGS: The appendix is not visualized.

Ancillary findings: None.

Factors affecting image quality: Exam detail is suboptimal due to
patient's body habitus.

Other findings: None.
IMPRESSION: Nonvisualization of the appendix. Diminished exam detail due to
patient body habitus.

## 2023-10-07 ENCOUNTER — Ambulatory Visit: Payer: Medicaid Other | Admitting: Family Medicine

## 2023-10-07 ENCOUNTER — Telehealth: Payer: Self-pay | Admitting: *Deleted

## 2023-10-07 NOTE — Telephone Encounter (Signed)
 Called pt and left vm to call office back to reschedule missed New Patient appt

## 2023-12-07 ENCOUNTER — Encounter (HOSPITAL_BASED_OUTPATIENT_CLINIC_OR_DEPARTMENT_OTHER): Payer: Self-pay | Admitting: Emergency Medicine

## 2023-12-07 ENCOUNTER — Emergency Department (HOSPITAL_BASED_OUTPATIENT_CLINIC_OR_DEPARTMENT_OTHER)
Admission: EM | Admit: 2023-12-07 | Discharge: 2023-12-08 | Disposition: A | Discharge status: Home / Self Care | Attending: Emergency Medicine | Admitting: Emergency Medicine

## 2023-12-07 ENCOUNTER — Other Ambulatory Visit: Payer: Self-pay

## 2023-12-07 DIAGNOSIS — L02416 Cutaneous abscess of left lower limb: Secondary | ICD-10-CM | POA: Insufficient documentation

## 2023-12-07 DIAGNOSIS — L731 Pseudofolliculitis barbae: Secondary | ICD-10-CM | POA: Diagnosis present

## 2023-12-07 NOTE — ED Triage Notes (Signed)
 Pt c/o abscess to left inguinal area, first noticed yesterday. Pain rated 7/10.

## 2023-12-08 MED ORDER — ACETAMINOPHEN 500 MG PO TABS
1000.0000 mg | ORAL_TABLET | Freq: Once | ORAL | Status: AC
  Administered 2023-12-08: 1000 mg via ORAL
  Filled 2023-12-08: qty 2

## 2023-12-08 MED ORDER — NAPROXEN 500 MG PO TABS: 500.0000 mg | ORAL_TABLET | Freq: Two times a day (BID) | ORAL | 0 refills | Status: DC

## 2023-12-08 MED ORDER — NAPROXEN 250 MG PO TABS
500.0000 mg | ORAL_TABLET | Freq: Once | ORAL | Status: AC
  Administered 2023-12-08: 500 mg via ORAL
  Filled 2023-12-08: qty 2

## 2023-12-08 MED ORDER — CEPHALEXIN 500 MG PO CAPS: 500.0000 mg | ORAL_CAPSULE | Freq: Four times a day (QID) | ORAL | 0 refills | Status: DC

## 2023-12-08 MED ORDER — CEPHALEXIN 250 MG PO CAPS
500.0000 mg | ORAL_CAPSULE | Freq: Once | ORAL | Status: AC
  Administered 2023-12-08: 500 mg via ORAL
  Filled 2023-12-08: qty 2

## 2023-12-08 NOTE — ED Provider Notes (Signed)
 [place] [facility] Provider Note   CSN: [id number] Arrival date & time: 12/07/23  2336     History  Chief Complaint  Patient presents with   Abscess    Angela Cantrell is a 33 y.o. female.  The history is provided by the patient.  Abscess Abscess location: left proximal medial thigh. Abscess quality: painful and redness   Abscess quality: not draining, no fluctuance and not weeping   Red streaking: no   Duration:  1 day Pain details:    Quality:  Pressure   Severity:  Moderate   Timing:  Constant   Progression:  Unchanged Chronicity:  New Context: not insect bite/sting   Relieved by:  Nothing Worsened by:  Nothing Ineffective treatments:  None tried Associated symptoms: no anorexia and no fever   Risk factors: no family hx of MRSA        Home Medications Prior to Admission medications   Medication Sig Start Date End Date Taking? Authorizing Provider  cephALEXin (KEFLEX) 500 MG capsule Take 1 capsule (500 mg total) by mouth 4 (four) times daily. 12/08/23  Yes Kabrina Christiano, MD  naproxen (NAPROSYN) 500 MG tablet Take 1 tablet (500 mg total) by mouth 2 (two) times daily with a meal. 12/08/23  Yes Jetaime Pinnix, MD  doxycycline (VIBRAMYCIN) 100 MG capsule Take 1 capsule (100 mg total) by mouth 2 (two) times daily. One po bid x 7 days 10/24/22   Geoffery Lyons, MD  indomethacin (INDOCIN) 50 MG capsule Take 1 capsule (50 mg total) by mouth 3 (three) times daily with meals. 09/30/22   Mardella Layman, MD  NIFEdipine (PROCARDIA XL/NIFEDICAL XL) 60 MG 24 hr tablet Take 1 tablet by mouth daily. 06/06/22   [provider]  predniSONE (DELTASONE) 20 MG tablet Take 2 tablets (40 mg total) by mouth daily. 09/30/22   Mardella Layman, MD      Allergies    Patient has no known allergies.    Review of Systems   Review of Systems  Constitutional:  Negative for fever.  HENT:  Negative for facial swelling.   Eyes:  Negative for  photophobia.  Gastrointestinal:  Negative for anorexia.  All other systems reviewed and are negative.   Physical Exam Updated Vital Signs BP (!) 138/93 (BP Location: Right Arm)   Pulse 70   Temp 98.6 F (37 C)   Resp 20   Ht 5\' 2"  (1.575 m)   Wt 86.2 kg   LMP 11/23/2023 (Approximate)   SpO2 100%   Breastfeeding No   BMI 34.75 kg/m  Physical Exam Vitals and nursing note reviewed. Exam conducted with a chaperone present.  Constitutional:      General: She is not in acute distress.    Appearance: Normal appearance. She is well-developed.  HENT:     Head: Normocephalic and atraumatic.     Nose: Nose normal.  Eyes:     Pupils: Pupils are equal, round, and reactive to light.  Cardiovascular:     Rate and Rhythm: Normal rate and regular rhythm.     Pulses: Normal pulses.     Heart sounds: Normal heart sounds.  Pulmonary:     Effort: Pulmonary effort is normal. No respiratory distress.     Breath sounds: Normal breath sounds.  Abdominal:     General: Bowel sounds are normal. There is no distension.     Palpations: Abdomen is soft.     Tenderness: There is no abdominal tenderness. There  is no guarding or rebound.  Genitourinary:   Musculoskeletal:        General: Normal range of motion.     Cervical back: Neck supple.  Skin:    General: Skin is warm and dry.     Capillary Refill: Capillary refill takes less than 2 seconds.     Findings: No erythema or rash.  Neurological:     General: No focal deficit present.     Mental Status: She is alert and oriented to person, place, and time.  Psychiatric:        Mood and Affect: Mood normal.     ED Results / Procedures / Treatments   Labs (all labs ordered are listed, but only abnormal results are displayed) Labs Reviewed - No data to display  EKG None  Radiology No results found.  Procedures Procedures    Medications Ordered in ED Medications  naproxen (NAPROSYN) tablet 500 mg (has no administration in time  range)  acetaminophen (TYLENOL) tablet 1,000 mg (has no administration in time range)  cephALEXin (KEFLEX) capsule 500 mg (has no administration in time range)    ED Course/ Medical Decision Making/ A&P                                 Medical Decision Making Elevated area in the groin after vacation   Amount and/or Complexity of Data Reviewed External Data Reviewed: notes.    Details: Previous notes reviewed   Risk OTC drugs. Prescription drug management. Risk Details: Bedside US without abscess.  This is an ingrown hair.  Will start antibiotics.  Stable for discharge with close follow up.     Final Clinical Impression(s) / ED Diagnoses Final diagnoses:  Ingrown hair   No signs of systemic illness or infection. The patient is nontoxic-appearing on exam and vital signs are within normal limits.  I have reviewed the triage vital signs and the nursing notes. Pertinent labs & imaging results that were available during my care of the patient were reviewed by me and considered in my medical decision making (see chart for details). After history, exam, and medical workup I feel the patient has been appropriately medically screened and is safe for discharge home. Pertinent diagnoses were discussed with the patient. Patient was given return precautions.  Rx / DC Orders ED Discharge Orders          Ordered    cephALEXin (KEFLEX) 500 MG capsule  4 times daily        12/08/23 0013    naproxen (NAPROSYN) 500 MG tablet  2 times daily with meals        12/08/23 0013              Ajani Schnieders, MD 12/08/23 0981

## 2024-08-11 ENCOUNTER — Ambulatory Visit: Payer: Self-pay | Admitting: Family Medicine

## 2024-08-11 ENCOUNTER — Encounter: Payer: Self-pay | Admitting: Family Medicine

## 2024-08-11 VITALS — BP 120/60 | HR 78 | Temp 98.2°F | Ht 62.0 in | Wt 208.0 lb

## 2024-08-11 DIAGNOSIS — E041 Nontoxic single thyroid nodule: Secondary | ICD-10-CM | POA: Insufficient documentation

## 2024-08-11 DIAGNOSIS — Z7689 Persons encountering health services in other specified circumstances: Secondary | ICD-10-CM

## 2024-08-11 DIAGNOSIS — Z6838 Body mass index (BMI) 38.0-38.9, adult: Secondary | ICD-10-CM

## 2024-08-11 DIAGNOSIS — R4586 Emotional lability: Secondary | ICD-10-CM

## 2024-08-11 DIAGNOSIS — F17200 Nicotine dependence, unspecified, uncomplicated: Secondary | ICD-10-CM | POA: Diagnosis not present

## 2024-08-11 DIAGNOSIS — F331 Major depressive disorder, recurrent, moderate: Secondary | ICD-10-CM

## 2024-08-11 DIAGNOSIS — E66812 Obesity, class 2: Secondary | ICD-10-CM | POA: Diagnosis not present

## 2024-08-11 DIAGNOSIS — L659 Nonscarring hair loss, unspecified: Secondary | ICD-10-CM

## 2024-08-11 MED ORDER — BUPROPION HCL ER (SR) 150 MG PO TB12: 150.0000 mg | ORAL_TABLET | Freq: Two times a day (BID) | ORAL | 0 refills | Status: DC

## 2024-08-11 MED ORDER — NICOTINE 7 MG/24HR TD PT24: 7.0000 mg | MEDICATED_PATCH | Freq: Every day | TRANSDERMAL | 5 refills | Status: DC

## 2024-08-11 NOTE — Progress Notes (Addendum)
 I,[name], CMA,acting as a neurosurgeon for [employer], NP.,have documented all relevant documentation on the behalf of Angela Creighton, NP,as directed by  Angela Creighton, NP while in the presence of Angela Creighton, NP.  Subjective:  Patient ID: Angela Cantrell , female    DOB: 04/26/1991 , 33 y.o.   MRN: [id number]  Chief Complaint  Patient presents with   Establish Care    Patient presents today to establish care. Patient reports she last seen pcp 2 months ago. She doesn't feel she was listening to her.    hormones checked    Patient reports she would like her hormones. She has been feeling super hot, mood swings and irregular periods and hair loss. She reports she had a baby 2 years ago.     HPI Discussed the use of AI scribe software for clinical note transcription with the patient, who gave verbal consent to proceed.  History of Present Illness     Angela Cantrell is a 33 year old female who presents with concerns about neck lumps and hormonal issues.  She has had neck lumps since at least 2021, with a biopsy performed at that time. The lumps have increased in size and now extend down her neck. Despite various scans, including an ultrasound, which she reports have not shown any problems, she remains worried due to her growth and her high-risk HPV status, fearing potential throat cancer.  She experiences symptoms she attributes to hormonal changes, such as severe night sweats, hair loss, mood swings, and irregular menstrual periods. Her periods have become unpredictable, with a recent episode of bleeding for only one day, earlier than expected. She is concerned these symptoms may be related to perimenopause or menopause.  Her social history includes significant stressors, such as a recent move back in with her mother due to housing instability after eviction. She describes a tumultuous relationship with her mother, who has mental health issues, and recounts a period where she  'ran away' to find stability. She has two children, an 89 year old son and a 7-year-old child of unknown gender, and also a 34 year old son, with her oldest helping with childcare when possible.  She has a history of hidradenitis suppurativa and has avoided antibiotics for the past six months due to concerns about side effects and antibiotic resistance. She smokes about half a pack per day and has attempted to quit using nicotine  patches, which were ineffective due to poor adhesion.  She expresses anxiety about taking medications due to potential side effects and a family history of addiction, particularly on her father's side. She has been hesitant to start medications for depression or anxiety, despite experiencing significant stress and mood disturbances.      Past Medical History:  Diagnosis Date   High risk HPV infection    Hypertension    Pulmonary embolism (HCC)      Family History  Problem Relation Age of Onset   Hyperlipidemia Mother    Hypertension Mother    Hyperlipidemia Father    Hypertension Father    Lymphoma Paternal Grandmother    Diabetes Paternal Grandmother      Current Outpatient Medications:    buPROPion  (WELLBUTRIN  SR) 150 MG 12 hr tablet, Take 1 tablet (150 mg total) by mouth 2 (two) times daily. Take 1 tablet for 7 days, then 1 tablet twice daily afterwards, Disp: 180 tablet, Rfl: 0   [START ON 09/08/2024] nicotine  (NICODERM CQ  - DOSED IN MG/24 HR) 7 mg/24hr patch, Place 1 patch (  7 mg total) onto the skin daily., Disp: 28 patch, Rfl: 5   predniSONE  (DELTASONE ) 20 MG tablet, Take 2 tablets (40 mg total) by mouth daily., Disp: 10 tablet, Rfl: 0   cephALEXin  (KEFLEX ) 500 MG capsule, Take 1 capsule (500 mg total) by mouth 4 (four) times daily. (Patient not taking: Reported on 08/11/2024), Disp: 28 capsule, Rfl: 0   doxycycline  (VIBRAMYCIN ) 100 MG capsule, Take 1 capsule (100 mg total) by mouth 2 (two) times daily. One po bid x 7 days (Patient not taking: Reported  on 08/11/2024), Disp: 14 capsule, Rfl: 0   indomethacin  (INDOCIN ) 50 MG capsule, Take 1 capsule (50 mg total) by mouth 3 (three) times daily with meals. (Patient not taking: Reported on 08/11/2024), Disp: 15 capsule, Rfl: 0   naproxen  (NAPROSYN ) 500 MG tablet, Take 1 tablet (500 mg total) by mouth 2 (two) times daily with a meal. (Patient not taking: Reported on 08/11/2024), Disp: 10 tablet, Rfl: 0   NIFEdipine (PROCARDIA XL/NIFEDICAL XL) 60 MG 24 hr tablet, Take 1 tablet by mouth daily. (Patient not taking: Reported on 08/11/2024), Disp: , Rfl:    No Known Allergies   Review of Systems  Constitutional: Negative.   Respiratory: Negative.    Cardiovascular: Negative.   Gastrointestinal: Negative.   Musculoskeletal: Negative.   Skin: Negative.   Neurological: Negative.   Psychiatric/Behavioral:  Positive for behavioral problems and dysphoric mood. The patient is nervous/anxious.      Today's Vitals   08/11/24 1054  BP: 120/60  Pulse: 78  Temp: 98.2 F (36.8 C)  TempSrc: Oral  Weight: 208 lb (94.3 kg)  Height: 5' 2 (1.575 m)  PainSc: 0-No pain   Body mass index is 38.04 kg/m.  Wt Readings from Last 3 Encounters:  08/11/24 208 lb (94.3 kg)  12/07/23 190 lb (86.2 kg)  10/23/22 240 lb (108.9 kg)    The ASCVD Risk score (Arnett DK, et al., 2019) failed to calculate for the following reasons:   The 2019 ASCVD risk score is only valid for ages 69 to 42  Objective:  Physical Exam Constitutional:      Appearance: Normal appearance.  HENT:     Head: Normocephalic.  Cardiovascular:     Rate and Rhythm: Normal rate and regular rhythm.     Pulses: Normal pulses.     Heart sounds: Normal heart sounds.  Pulmonary:     Effort: Pulmonary effort is normal.     Breath sounds: Normal breath sounds.  Abdominal:     General: Bowel sounds are normal.  Neurological:     Mental Status: She is alert.         Assessment And Plan:   Assessment & Plan Encounter to establish care with  new doctor  Hair loss Check TSH Nicotine  dependence with current use Chronic tobacco use with unsuccessful past quit attempts. Discussed health impact and cessation benefits. - Prescribed nicotine  patches with refills for smoking cessation. Moderate episode of recurrent major depressive disorder (HCC) Start wellbutrin  Class 2 severe obesity due to excess calories with serious comorbidity and body mass index (BMI) of 38.0 to 38.9 in adult She is encouraged to strive for BMI less than 30 to decrease cardiac risk. Advised to aim for at least 150 minutes of exercise per week.      Return in about 2 months (around 10/11/2024) for evaluation depression.  Patient was given opportunity to ask questions. Patient verbalized understanding of the plan and was able to repeat key elements of  the plan. All questions were answered to their satisfaction.     I, Angela Creighton, NP, have reviewed all documentation for this visit. The documentation on 08/15/2024 for the exam, diagnosis, procedures, and orders are all accurate and complete.     IF YOU HAVE BEEN REFERRED TO A SPECIALIST, IT MAY TAKE 1-2 WEEKS TO SCHEDULE/PROCESS THE REFERRAL. IF YOU HAVE NOT HEARD FROM US /SPECIALIST IN TWO WEEKS, PLEASE GIVE US  A CALL AT [phone].

## 2024-08-12 LAB — CBC WITH DIFFERENTIAL/PLATELET
Basophils Absolute: 0 x10E3/uL (ref 0.0–0.2)
Basos: 1 %
EOS (ABSOLUTE): 0.1 x10E3/uL (ref 0.0–0.4)
Eos: 1 %
Hematocrit: 40 % (ref 34.0–46.6)
Hemoglobin: 13.1 g/dL (ref 11.1–15.9)
Immature Grans (Abs): 0 x10E3/uL (ref 0.0–0.1)
Immature Granulocytes: 0 %
Lymphocytes Absolute: 2 x10E3/uL (ref 0.7–3.1)
Lymphs: 33 %
MCH: 31.2 pg (ref 26.6–33.0)
MCHC: 32.8 g/dL (ref 31.5–35.7)
MCV: 95 fL (ref 79–97)
Monocytes Absolute: 0.4 x10E3/uL (ref 0.1–0.9)
Monocytes: 7 %
Neutrophils Absolute: 3.6 x10E3/uL (ref 1.4–7.0)
Neutrophils: 58 %
Platelets: 273 x10E3/uL (ref 150–450)
RBC: 4.2 x10E6/uL (ref 3.77–5.28)
RDW: 13.3 % (ref 11.7–15.4)
WBC: 6.1 x10E3/uL (ref 3.4–10.8)

## 2024-08-12 LAB — COMPREHENSIVE METABOLIC PANEL WITH GFR
ALT: 11 IU/L (ref 0–32)
AST: 14 IU/L (ref 0–40)
Albumin: 4.1 g/dL (ref 3.9–4.9)
Alkaline Phosphatase: 66 IU/L (ref 41–116)
BUN/Creatinine Ratio: 12 (ref 9–23)
BUN: 12 mg/dL (ref 6–20)
Bilirubin Total: 0.5 mg/dL (ref 0.0–1.2)
CO2: 23 mmol/L (ref 20–29)
Calcium: 9 mg/dL (ref 8.7–10.2)
Chloride: 105 mmol/L (ref 96–106)
Creatinine, Ser: 1.01 mg/dL — ABNORMAL HIGH (ref 0.57–1.00)
Globulin, Total: 2.4 g/dL (ref 1.5–4.5)
Glucose: 87 mg/dL (ref 70–99)
Potassium: 4.7 mmol/L (ref 3.5–5.2)
Sodium: 142 mmol/L (ref 134–144)
Total Protein: 6.5 g/dL (ref 6.0–8.5)
eGFR: 75 mL/min/1.73 (ref >59)

## 2024-08-12 LAB — TSH+FREE T4
Free T4: 1.16 ng/dL (ref 0.82–1.77)
TSH: 0.699 u[IU]/mL (ref 0.450–4.500)

## 2024-08-12 LAB — VITAMIN D 25 HYDROXY (VIT D DEFICIENCY, FRACTURES): Vit D, 25-Hydroxy: 19.7 ng/mL — ABNORMAL LOW (ref 30.0–100.0)

## 2024-08-22 ENCOUNTER — Ambulatory Visit: Payer: Self-pay | Admitting: Family Medicine

## 2024-08-22 DIAGNOSIS — Z7689 Persons encountering health services in other specified circumstances: Secondary | ICD-10-CM | POA: Insufficient documentation

## 2024-08-22 DIAGNOSIS — R4586 Emotional lability: Secondary | ICD-10-CM | POA: Insufficient documentation

## 2024-08-22 MED ORDER — VITAMIN D (ERGOCALCIFEROL) 1.25 MG (50000 UNIT) PO CAPS: 50000.0000 [IU] | ORAL_CAPSULE | ORAL | 2 refills | Status: DC

## 2024-08-22 NOTE — Assessment & Plan Note (Signed)
 Start wellbutrin

## 2024-08-22 NOTE — Assessment & Plan Note (Signed)
 Chronic tobacco use with unsuccessful past quit attempts. Discussed health impact and cessation benefits. - Prescribed nicotine  patches with refills for smoking cessation.

## 2024-08-22 NOTE — Assessment & Plan Note (Signed)
 Check TSH

## 2024-08-22 NOTE — Assessment & Plan Note (Signed)
 She is encouraged to strive for BMI less than 30 to decrease cardiac risk. Advised to aim for at least 150 minutes of exercise per week.

## 2024-08-22 NOTE — Progress Notes (Signed)
 Your thyroid , blood count and electrolytes are normal. Liver and kidney function tests are normal also. Your vitamin D  level is low, 50,000 units of vitamin D  has been sent to the pharmacy. Take one caplet weekly .   Thanks!

## 2024-10-14 ENCOUNTER — Ambulatory Visit: Payer: Self-pay

## 2024-10-14 ENCOUNTER — Ambulatory Visit: Admitting: Family Medicine

## 2024-10-14 NOTE — Telephone Encounter (Signed)
 FYI Only or Action Required?: FYI only for provider: appointment scheduled on 2/5.  Patient was last seen in primary care on 08/11/2024 by Petrina Pries, NP.  Called Nurse Triage reporting Appointment.  Symptoms began several days ago.  Interventions attempted: Nothing.  Symptoms are: unchanged.  Triage Disposition: See PCP Within 2 Weeks  Patient/caregiver understands and will follow disposition?: Yes     Message from Antwanette L sent at 10/14/2024 10:41 AM EST  Reason for Triage: The pt called to r/s her appt today. The pt reports mucus in her stool and yellow-green discharge from the left breast w/ an odor. She also notes some pain and bumps in the affected breast.    Reason for Disposition  Requesting regular office appointment  Answer Assessment - Initial Assessment Questions 1. REASON FOR CALL: What is the main reason for your call? or How can I best help you?     Patient calling to reschedule her appointment for today as she will not be able to make it in. She reports  mucus in her stool, with and yellow-green discharge from the left breast w/ an odor. She also notes some pain and bumps in the affected breast. She is specifically requesting to be seen on a Thursday due to that being her only off day she's available to come in. Appt. Was rescheduled for 2/5. She agrees with plan of care, and will reach out if symptoms worsen.  Protocols used: Information Only Call - No Triage-A-AH

## 2024-10-21 ENCOUNTER — Ambulatory Visit: Admitting: Family Medicine

## 2024-10-21 ENCOUNTER — Encounter: Payer: Self-pay | Admitting: Family Medicine

## 2024-10-21 VITALS — BP 130/82 | HR 57 | Temp 98.3°F | Ht 62.0 in | Wt 213.0 lb

## 2024-10-21 DIAGNOSIS — N644 Mastodynia: Secondary | ICD-10-CM | POA: Insufficient documentation

## 2024-10-21 DIAGNOSIS — F331 Major depressive disorder, recurrent, moderate: Secondary | ICD-10-CM

## 2024-10-21 DIAGNOSIS — R21 Rash and other nonspecific skin eruption: Secondary | ICD-10-CM | POA: Insufficient documentation

## 2024-10-21 DIAGNOSIS — F411 Generalized anxiety disorder: Secondary | ICD-10-CM | POA: Insufficient documentation

## 2024-10-21 MED ORDER — TRIAMCINOLONE ACETONIDE 0.1 % EX CREA: TOPICAL_CREAM | CUTANEOUS | 1 refills | Status: AC

## 2024-10-21 MED ORDER — BUPROPION HCL ER (XL) 300 MG PO TB24: 300.0000 mg | ORAL_TABLET | ORAL | 1 refills | Status: AC

## 2024-10-21 MED ORDER — VITAMIN D (ERGOCALCIFEROL) 1.25 MG (50000 UNIT) PO CAPS: 50000.0000 [IU] | ORAL_CAPSULE | ORAL | 2 refills | Status: AC

## 2024-10-21 MED ORDER — DOXYCYCLINE HYCLATE 100 MG PO TABS: 100.0000 mg | ORAL_TABLET | Freq: Two times a day (BID) | ORAL | 0 refills | Status: AC

## 2024-10-21 MED ORDER — HYDROXYZINE HCL 25 MG PO TABS: 25.0000 mg | ORAL_TABLET | Freq: Every evening | ORAL | 0 refills | Status: AC | PRN

## 2024-10-21 NOTE — Progress Notes (Unsigned)
 I,Angela Cantrell, CMA,acting as a neurosurgeon for [employer], NP.,have documented all relevant documentation on the behalf of Angela Creighton, NP,as directed by  Angela Creighton, NP while in the presence of Angela Creighton, NP.  Subjective:  Patient ID: Angela Cantrell , female    DOB: [birth year] , 34 y.o.   MRN: [id number]  Chief Complaint  Patient presents with   Breast Pain    Patient reports she feels like she have an infection in her left breast. She reports she is having discharge from it. She would like to have a cancer screening done.    stool concerns    Patient reports she has been having mucus in her stool for a while. She reports she had an episode where her stomach was hurting real and when she went to have a BM she passed a small stool and a bunch of mucus.     HPI  HPI   Past Medical History:  Diagnosis Date   High risk HPV infection    Hypertension    Pulmonary embolism (HCC)      Family History  Problem Relation Age of Onset   Hyperlipidemia Mother    Hypertension Mother    Hyperlipidemia Father    Hypertension Father    Lymphoma Paternal Grandmother    Diabetes Paternal Grandmother     Current Medications[1]   Allergies[2]   Review of Systems   Today's Vitals   10/21/24 1106  BP: 130/82  Pulse: (!) 57  Temp: 98.3 F (36.8 C)  TempSrc: Oral  Weight: 213 lb (96.6 kg)  Height: 5' 2 (1.575 m)  PainSc: 0-No pain   Body mass index is 38.96 kg/m.  Wt Readings from Last 3 Encounters:  10/21/24 213 lb (96.6 kg)  08/11/24 208 lb (94.3 kg)  12/07/23 190 lb (86.2 kg)    The ASCVD Risk score (Arnett DK, et al., 2019) failed to calculate for the following reasons:   The 2019 ASCVD risk score is only valid for ages 102 to 17  Objective:  Physical Exam      Assessment And Plan:   Assessment & Plan Breast pain  Mucus in stool  Class 2 severe obesity due to excess calories with serious comorbidity and body mass index (BMI) of 38.0 to 38.9 in  adult   No orders of the defined types were placed in this encounter.    No follow-ups on file.  Patient was given opportunity to ask questions. Patient verbalized understanding of the plan and was able to repeat key elements of the plan. All questions were answered to their satisfaction.    I, Angela Creighton, NP, have reviewed all documentation for this visit. The documentation on 10/21/24 for the exam, diagnosis, procedures, and orders are all accurate and complete.   IF YOU HAVE BEEN REFERRED TO A SPECIALIST, IT MAY TAKE 1-2 WEEKS TO SCHEDULE/PROCESS THE REFERRAL. IF YOU HAVE NOT HEARD FROM US /SPECIALIST IN TWO WEEKS, PLEASE GIVE US  A CALL AT [phone].     [1]  Current Outpatient Medications:    buPROPion  (WELLBUTRIN  SR) 150 MG 12 hr tablet, Take 1 tablet (150 mg total) by mouth 2 (two) times daily. Take 1 tablet for 7 days, then 1 tablet twice daily afterwards, Disp: 180 tablet, Rfl: 0   Vitamin D , Ergocalciferol , (DRISDOL ) 1.25 MG (50000 UNIT) CAPS capsule, Take 1 capsule (50,000 Units total) by mouth every 7 (seven) days., Disp: 12 capsule, Rfl: 2 [2] No Known Allergies

## 2024-11-11 ENCOUNTER — Institutional Professional Consult (permissible substitution): Payer: Self-pay | Admitting: Licensed Clinical Social Worker

## 2024-12-23 ENCOUNTER — Ambulatory Visit: Payer: Self-pay | Admitting: Family Medicine
